# Patient Record
Sex: Female | Born: 1985 | Hispanic: Yes | Marital: Married | Smoking: Never smoker
Health system: Southern US, Community
[De-identification: ages and names within clinical notes are randomized; demographics above are authoritative.]

## PROBLEM LIST (undated history)

## (undated) ENCOUNTER — Inpatient Hospital Stay (HOSPITAL_COMMUNITY): Payer: Self-pay

## (undated) DIAGNOSIS — J45909 Unspecified asthma, uncomplicated: Secondary | ICD-10-CM

## (undated) HISTORY — PX: ABDOMINOPLASTY: SUR9

## (undated) HISTORY — DX: Unspecified asthma, uncomplicated: J45.909

## (undated) HISTORY — PX: RIGHT OOPHORECTOMY: SHX2359

## (undated) HISTORY — PX: BREAST REDUCTION SURGERY: SHX8

---

## 2014-01-28 LAB — OB RESULTS CONSOLE HIV ANTIBODY (ROUTINE TESTING): HIV: NONREACTIVE

## 2014-01-28 LAB — OB RESULTS CONSOLE HGB/HCT, BLOOD
HCT: 40 %
Hemoglobin: 13.8 g/dL

## 2014-01-28 LAB — OB RESULTS CONSOLE ABO/RH: RH TYPE: POSITIVE

## 2014-01-28 LAB — OB RESULTS CONSOLE ANTIBODY SCREEN: Antibody Screen: NEGATIVE

## 2014-01-28 LAB — OB RESULTS CONSOLE PLATELET COUNT: Platelets: 276 10*3/uL

## 2014-01-28 LAB — OB RESULTS CONSOLE HEPATITIS B SURFACE ANTIGEN: HEP B S AG: NEGATIVE

## 2014-01-28 LAB — OB RESULTS CONSOLE RPR: RPR: NONREACTIVE

## 2014-02-20 LAB — US OB COMP + 14 WK

## 2014-04-24 NOTE — L&D Delivery Note (Cosign Needed)
Delivery Note At 7:13 PM a viable female was delivered via  (Presentation: ROA), delivered through loose nuchal x 1 which was reduced immediately after birth.  APGAR: pending at time of note- infant w/ spontaneous cry/respiration; Infant placed directly on mom's abdomen for bonding/skin-to-skin. Cord allowed to stop pulsing, and was clamped x 2, and cut by fob.  Weight: pending at time of note.   Placenta status: delivered spontaneously intact.  Cord: 3VC,  with the following complications: none.  \ Anesthesia:  epidural Episiotomy:  n/a Lacerations:  none Suture Repair: n/a Est. Blood Loss (mL):  350ml  Mom to postpartum.  Baby to Couplet care / Skin to Skin. Plans to breastfeed, depo for contraception, planning circ-undecided about IP vs OP  Marge DuncansBooker, Kellyjo Edgren Randall 08/01/2014, 7:29 PM

## 2014-05-08 ENCOUNTER — Encounter: Payer: Self-pay | Admitting: *Deleted

## 2014-05-14 ENCOUNTER — Encounter: Payer: Self-pay | Admitting: Family Medicine

## 2014-05-18 ENCOUNTER — Encounter: Payer: Self-pay | Admitting: Advanced Practice Midwife

## 2014-05-25 ENCOUNTER — Ambulatory Visit (INDEPENDENT_AMBULATORY_CARE_PROVIDER_SITE_OTHER): Payer: Self-pay | Admitting: Family Medicine

## 2014-05-25 ENCOUNTER — Encounter: Payer: Self-pay | Admitting: Family Medicine

## 2014-05-25 VITALS — BP 111/65 | HR 113 | Temp 98.4°F | Wt 140.0 lb

## 2014-05-25 DIAGNOSIS — O09293 Supervision of pregnancy with other poor reproductive or obstetric history, third trimester: Secondary | ICD-10-CM

## 2014-05-25 DIAGNOSIS — O0993 Supervision of high risk pregnancy, unspecified, third trimester: Secondary | ICD-10-CM

## 2014-05-25 DIAGNOSIS — O0933 Supervision of pregnancy with insufficient antenatal care, third trimester: Secondary | ICD-10-CM | POA: Insufficient documentation

## 2014-05-25 DIAGNOSIS — O09299 Supervision of pregnancy with other poor reproductive or obstetric history, unspecified trimester: Secondary | ICD-10-CM | POA: Insufficient documentation

## 2014-05-25 LAB — POCT URINALYSIS DIP (DEVICE)
BILIRUBIN URINE: NEGATIVE
GLUCOSE, UA: NEGATIVE mg/dL
KETONES UR: NEGATIVE mg/dL
Nitrite: NEGATIVE
PROTEIN: NEGATIVE mg/dL
SPECIFIC GRAVITY, URINE: 1.02 (ref 1.005–1.030)
UROBILINOGEN UA: 0.2 mg/dL (ref 0.0–1.0)
pH: 6 (ref 5.0–8.0)

## 2014-05-25 LAB — CBC
HCT: 36.6 % (ref 36.0–46.0)
Hemoglobin: 12.5 g/dL (ref 12.0–15.0)
MCH: 28.7 pg (ref 26.0–34.0)
MCHC: 34.2 g/dL (ref 30.0–36.0)
MCV: 84.1 fL (ref 78.0–100.0)
MPV: 9.4 fL (ref 8.6–12.4)
PLATELETS: 229 10*3/uL (ref 150–400)
RBC: 4.35 MIL/uL (ref 3.87–5.11)
RDW: 12.7 % (ref 11.5–15.5)
WBC: 7.8 10*3/uL (ref 4.0–10.5)

## 2014-05-25 LAB — GLUCOSE TOLERANCE, 1 HOUR (50G) W/O FASTING: GLUCOSE 1 HOUR GTT: 81 mg/dL (ref 70–140)

## 2014-05-25 LAB — RPR

## 2014-05-25 MED ORDER — ASPIRIN EC 81 MG PO TBEC: 81.0000 mg | DELAYED_RELEASE_TABLET | Freq: Every day | ORAL | Status: DC

## 2014-05-25 NOTE — Progress Notes (Signed)
U/S 06/01/14 @ 1p with Radiology.  Spanish interpreter Maretta LosBlanca Lindner present.

## 2014-05-25 NOTE — Progress Notes (Signed)
C/o of asthma complications since becoming pregnant-- reports having asthma since she was a child though has not been on meds since.  [place] records in hand. Prenatal panel had been drawn. No pap. Pap today with 28 week labs.

## 2014-05-25 NOTE — Progress Notes (Signed)
   Subjective:    Kristy Evans is a [id number] being seen today for her first obstetrical visit.  Her obstetrical history is significant for pre-eclampsia. Patient does not intend to breast feed due to breast reduction. Pregnancy history fully reviewed.  Had some care in [place].  No fevers, rash, headache indicative of Zika virus.  Surgical history includes abdominoplasty and breast reduction.  Patient reports no bleeding, no contractions, no cramping and no leaking.  Filed Vitals:   05/25/14 0913  BP: 111/65  Pulse: 113  Temp: 98.4 F (36.9 C)  Weight: 140 lb (63.504 kg)    HISTORY: OB History  Gravida Para Term Preterm AB SAB TAB Ectopic Multiple Living  3 1 0 1 1 0 1   2    # Outcome Date GA Lbr Len/2nd Weight Sex Delivery Anes PTL Lv  3 Current           2 Preterm 12/26/01 [id number]    Vag-Spont   Y  1 TAB              Past Medical History  Diagnosis Date  . Asthma    Past Surgical History  Procedure Laterality Date  . Breast reduction surgery    . Right oophorectomy     History reviewed. No pertinent family history.   Exam    Uterus:  Fundal Height: 30 cm  Pelvic Exam:    Perineum: No Hemorrhoids, Normal Perineum   Vulva: normal, Bartholin's, Urethra, Skene's normal   Vagina:  normal mucosa   Cervix: multiparous appearance   Adnexa: not evaluated   Bony Pelvis: gynecoid  System:     Skin: normal coloration and turgor, no rashes    Neurologic: oriented   Extremities: normal strength, tone, and muscle mass   HEENT PERRLA and extra ocular movement intact   Mouth/Teeth mucous membranes moist, pharynx normal without lesions   Neck supple and no masses   Cardiovascular: regular rate and rhythm, no murmurs or gallops   Respiratory:  appears well, vitals normal, no respiratory distress, acyanotic, normal RR, ear and throat exam is normal, neck free of mass or lymphadenopathy, chest clear, no wheezing, crepitations, rhonchi, normal symmetric air  entry   Abdomen: soft, non-tender; bowel sounds normal; no masses,  no organomegaly   Urinary: urethral meatus normal      Assessment:    Pregnancy: [id number] Patient Active Problem List   Diagnosis Date Noted  . Supervision of high risk pregnancy in third trimester 05/25/2014  . Insufficient prenatal care in third trimester 05/25/2014  . Hx of preeclampsia, prior pregnancy, currently pregnant 05/25/2014        Plan:     Initial labs drawn. Prenatal vitamins. Problem list reviewed and updated. Genetic Screening: late to care.  Ultrasound discussed; fetal survey: ordered.  Follow up in 2 weeks. 50% of 30 min visit spent on counseling and coordination of care.  28 week labs.  Fetal survey US ordered.    Start ASA 81mg  for preeclampsia prevention.   Candelaria CelesteSTINSON, Siri Buege JEHIEL 05/25/2014

## 2014-05-27 LAB — PRESCRIPTION MONITORING PROFILE (19 PANEL)
Amphetamine/Meth: NEGATIVE ng/mL
BARBITURATE SCREEN, URINE: NEGATIVE ng/mL
Benzodiazepine Screen, Urine: NEGATIVE ng/mL
Buprenorphine, Urine: NEGATIVE ng/mL
Cannabinoid Scrn, Ur: NEGATIVE ng/mL
Carisoprodol, Urine: NEGATIVE ng/mL
Cocaine Metabolites: NEGATIVE ng/mL
Creatinine, Urine: 120.51 mg/dL (ref >20.0)
FENTANYL URINE: NEGATIVE ng/mL
MDMA URINE: NEGATIVE ng/mL
MEPERIDINE UR: NEGATIVE ng/mL
METHADONE SCREEN, URINE: NEGATIVE ng/mL
METHAQUALONE SCREEN (URINE): NEGATIVE ng/mL
Nitrites, Initial: NEGATIVE ug/mL
Opiate Screen, Urine: NEGATIVE ng/mL
Oxycodone Screen, Ur: NEGATIVE ng/mL
PHENCYCLIDINE, UR: NEGATIVE ng/mL
PROPOXYPHENE: NEGATIVE ng/mL
Tapentadol, urine: NEGATIVE ng/mL
Tramadol Scrn, Ur: NEGATIVE ng/mL
ZOLPIDEM, URINE: NEGATIVE ng/mL
pH, Initial: 5.9 pH (ref 4.5–8.9)

## 2014-05-28 LAB — CYTOLOGY - PAP

## 2014-05-30 LAB — CULTURE, OB URINE: Colony Count: 45000

## 2014-06-01 ENCOUNTER — Telehealth: Payer: Self-pay | Admitting: *Deleted

## 2014-06-01 ENCOUNTER — Ambulatory Visit (HOSPITAL_COMMUNITY)
Admission: RE | Admit: 2014-06-01 | Discharge: 2014-06-01 | Disposition: A | Discharge status: Home / Self Care | Payer: Medicaid Other | Source: Ambulatory Visit | Attending: Family Medicine | Admitting: Family Medicine

## 2014-06-01 DIAGNOSIS — O0933 Supervision of pregnancy with insufficient antenatal care, third trimester: Secondary | ICD-10-CM | POA: Diagnosis not present

## 2014-06-01 DIAGNOSIS — Z3689 Encounter for other specified antenatal screening: Secondary | ICD-10-CM | POA: Insufficient documentation

## 2014-06-01 DIAGNOSIS — Z3A31 31 weeks gestation of pregnancy: Secondary | ICD-10-CM | POA: Diagnosis not present

## 2014-06-01 DIAGNOSIS — O09213 Supervision of pregnancy with history of pre-term labor, third trimester: Secondary | ICD-10-CM | POA: Diagnosis not present

## 2014-06-01 DIAGNOSIS — Z36 Encounter for antenatal screening of mother: Secondary | ICD-10-CM | POA: Diagnosis present

## 2014-06-01 DIAGNOSIS — O0993 Supervision of high risk pregnancy, unspecified, third trimester: Secondary | ICD-10-CM

## 2014-06-01 LAB — RUBELLA ANTIBODY, IGM: Rubella IgM: 0.49

## 2014-06-01 NOTE — Telephone Encounter (Signed)
[facility] there is confusion regarding EDD. There is an ultrasound scanned in from 02/20/14 from outside the system, but EDD is selected from a 02/02/14 ultrasound which is not scanned in.  Reveiwed chart and could not find another ultrasound or selection of EDD selection. They will put their reccomendations on ultrasound and will discuss with patient with interpreter her LMP/ ultrasound history.  Will send note to Dr. Adrian BlackwaterStinson.

## 2014-06-02 NOTE — Telephone Encounter (Signed)
In her scanned records, she had an earlier US with a due date of 07/31/14.  That is the most accurate dating.

## 2014-06-03 ENCOUNTER — Telehealth: Payer: Self-pay | Admitting: General Practice

## 2014-06-03 NOTE — Telephone Encounter (Signed)
-----   Message from Levie HeritageJacob J Stinson, DO sent at 06/02/2014  4:09 PM EST ----- Patient had positive Urine Cx at 45K colonies for pseudomonas.  Please call patient and have her come in for a single dose of rocephin 1g IM.  Thanks.

## 2014-06-03 NOTE — Telephone Encounter (Signed)
Called patient with [employer] for intepreter- no answer. Left message we are trying to reach you with some results, nothing urgent but please call us back.

## 2014-06-08 ENCOUNTER — Encounter: Payer: Self-pay | Admitting: Family Medicine

## 2014-06-10 NOTE — Telephone Encounter (Signed)
Called patient. Husband answered and stated he was at work and works Tuesday through Friday. Informed him we are trying to reach his wife with results-- husband states she will be in clinic 06/15/14. Asked that he have patient call as we would like to inform her of results and give treatment before hand if possible. Verbalized understanding.

## 2014-06-11 ENCOUNTER — Encounter: Payer: Self-pay | Admitting: *Deleted

## 2014-06-11 NOTE — Telephone Encounter (Signed)
Patient returned call to front office staff this morning. States she does not have transportation until Monday when her husband is off. Informed her we would like to treat her beforehand but that if she cannot come until Monday then we will treat her then. Patient verbalized understanding and stated she will likely have to wait until Monday.

## 2014-06-15 ENCOUNTER — Ambulatory Visit (INDEPENDENT_AMBULATORY_CARE_PROVIDER_SITE_OTHER): Payer: Medicaid Other | Admitting: Obstetrics & Gynecology

## 2014-06-15 ENCOUNTER — Encounter: Payer: Self-pay | Admitting: Obstetrics & Gynecology

## 2014-06-15 VITALS — BP 112/71 | HR 103 | Temp 98.2°F | Wt 138.6 lb

## 2014-06-15 DIAGNOSIS — N390 Urinary tract infection, site not specified: Secondary | ICD-10-CM

## 2014-06-15 DIAGNOSIS — N6099 Unspecified benign mammary dysplasia of unspecified breast: Secondary | ICD-10-CM

## 2014-06-15 DIAGNOSIS — O0993 Supervision of high risk pregnancy, unspecified, third trimester: Secondary | ICD-10-CM | POA: Diagnosis not present

## 2014-06-15 DIAGNOSIS — O2343 Unspecified infection of urinary tract in pregnancy, third trimester: Secondary | ICD-10-CM | POA: Diagnosis not present

## 2014-06-15 DIAGNOSIS — N62 Hypertrophy of breast: Secondary | ICD-10-CM

## 2014-06-15 DIAGNOSIS — Z23 Encounter for immunization: Secondary | ICD-10-CM

## 2014-06-15 LAB — POCT URINALYSIS DIP (DEVICE)
BILIRUBIN URINE: NEGATIVE
GLUCOSE, UA: NEGATIVE mg/dL
Hgb urine dipstick: NEGATIVE
Ketones, ur: NEGATIVE mg/dL
Nitrite: NEGATIVE
PH: 6.5 (ref 5.0–8.0)
Protein, ur: NEGATIVE mg/dL
Specific Gravity, Urine: 1.02 (ref 1.005–1.030)
Urobilinogen, UA: 0.2 mg/dL (ref 0.0–1.0)

## 2014-06-15 MED ORDER — CEFTRIAXONE SODIUM 1 G IJ SOLR
250.0000 mg | Freq: Once | INTRAMUSCULAR | Status: AC
  Administered 2014-06-15: 250 mg via INTRAMUSCULAR

## 2014-06-15 MED ORDER — TETANUS-DIPHTH-ACELL PERTUSSIS 5-2.5-18.5 LF-MCG/0.5 IM SUSP
0.5000 mL | Freq: Once | INTRAMUSCULAR | Status: AC
  Administered 2014-06-15: 0.5 mL via INTRAMUSCULAR

## 2014-06-15 MED ORDER — ASPIRIN EC 81 MG PO TBEC: 81.0000 mg | DELAYED_RELEASE_TABLET | Freq: Every day | ORAL | Status: DC

## 2014-06-15 NOTE — Progress Notes (Signed)
Patient not taking baby aspirin.  [name] used as interpreter for this encounter.  Needs 1gm Rocephin IM for UTI. Tdap today.

## 2014-06-15 NOTE — Patient Instructions (Signed)
Tercer trimestre de embarazo (Third Trimester of Pregnancy) El tercer trimestre va desde la semana29 hasta la 42, desde el sptimo hasta el noveno mes, y es la poca en la que el feto crece ms rpidamente. Hacia el final del noveno mes, el feto mide alrededor de 20pulgadas (45cm) de largo y pesa entre 6 y 10 libras (2,700 y 4,500kg).  CAMBIOS EN EL ORGANISMO Su organismo atraviesa por muchos cambios durante el embarazo, y estos varan de una mujer a otra.   Seguir aumentando de peso. Es de esperar que aumente entre 25 y 35libras (11 y 16kg) hacia el final del embarazo.  Podrn aparecer las primeras estras en las caderas, el abdomen y las mamas.  Puede tener necesidad de orinar con ms frecuencia porque el feto baja hacia la pelvis y ejerce presin sobre la vejiga.  Debido al embarazo podr sentir acidez estomacal con frecuencia.  Puede estar estreida, ya que ciertas hormonas enlentecen los movimientos de los msculos que empujan los desechos a travs de los intestinos.  Pueden aparecer hemorroides o abultarse e hincharse las venas (venas varicosas).  Puede sentir dolor plvico debido al aumento de peso y a que las hormonas del embarazo relajan las articulaciones entre los huesos de la pelvis. El dolor de espalda puede ser consecuencia de la sobrecarga de los msculos que soportan la postura.  Tal vez haya cambios en el cabello que pueden incluir su engrosamiento, crecimiento rpido y cambios en la textura. Adems, a algunas mujeres se les cae el cabello durante o despus del embarazo, o tienen el cabello seco o fino. Lo ms probable es que el cabello se le normalice despus del nacimiento del beb.  Las mamas seguirn creciendo y le dolern. A veces, puede haber una secrecin amarilla de las mamas llamada calostro.  El ombligo puede salir hacia afuera.  Puede sentir que le falta el aire debido a que se expande el tero.  Puede notar que el feto "baja" o lo siente ms bajo, en el  abdomen.  Puede tener una prdida de secrecin mucosa con sangre. Esto suele ocurrir en el trmino de unos pocos das a una semana antes de que comience el trabajo de parto.  El cuello del tero se vuelve delgado y blando (se borra) cerca de la fecha de parto. QU DEBE ESPERAR EN LOS EXMENES PRENATALES  Le harn exmenes prenatales cada 2semanas hasta la semana36. A partir de ese momento le harn exmenes semanales. Durante una visita prenatal de rutina:  La pesarn para asegurarse de que usted y el feto estn creciendo normalmente.  Le tomarn la presin arterial.  Le medirn el abdomen para controlar el desarrollo del beb.  Se escucharn los latidos cardacos fetales.  Se evaluarn los resultados de los estudios solicitados en visitas anteriores.  Le revisarn el cuello del tero cuando est prxima la fecha de parto para controlar si este se ha borrado. Alrededor de la semana36, el mdico le revisar el cuello del tero. Al mismo tiempo, realizar un anlisis de las secreciones del tejido vaginal. Este examen es para determinar si hay un tipo de bacteria, estreptococo Grupo B. El mdico le explicar esto con ms detalle. El mdico puede preguntarle lo siguiente:  Cmo le gustara que fuera el parto.  Cmo se siente.  Si siente los movimientos del beb.  Si ha tenido sntomas anormales, como prdida de lquido, sangrado, dolores de cabeza intensos o clicos abdominales.  Si tiene alguna pregunta. Otros exmenes o estudios de deteccin que pueden realizarse   durante el tercer trimestre incluyen lo siguiente:  Anlisis de sangre para controlar las concentraciones de hierro (anemia).  Controles fetales para determinar su salud, nivel de actividad y crecimiento. Si tiene alguna enfermedad o hay problemas durante el embarazo, le harn estudios. FALSO TRABAJO DE PARTO Es posible que sienta contracciones leves e irregulares que finalmente desaparecen. Se llaman contracciones de  Braxton Hicks o falso trabajo de parto. Las contracciones pueden durar horas, das o incluso semanas, antes de que el verdadero trabajo de parto se inicie. Si las contracciones ocurren a intervalos regulares, se intensifican o se hacen dolorosas, lo mejor es que la revise el mdico.  SIGNOS DE TRABAJO DE PARTO   Clicos de tipo menstrual.  Contracciones cada 5minutos o menos.  Contracciones que comienzan en la parte superior del tero y se extienden hacia abajo, a la zona inferior del abdomen y la espalda.  Sensacin de mayor presin en la pelvis o dolor de espalda.  Una secrecin de mucosidad acuosa o con sangre que sale de la vagina. Si tiene alguno de estos signos antes de la semana37 del embarazo, llame a su mdico de inmediato. Debe concurrir al hospital para que la controlen inmediatamente. INSTRUCCIONES PARA EL CUIDADO EN EL HOGAR   Evite fumar, consumir hierbas, beber alcohol y tomar frmacos que no le hayan recetado. Estas sustancias qumicas afectan la formacin y el desarrollo del beb.  Siga las indicaciones del mdico en relacin con el uso de medicamentos. Durante el embarazo, hay medicamentos que son seguros de tomar y otros que no.  Haga actividad fsica solo en la forma indicada por el mdico. Sentir clicos uterinos es un buen signo para detener la actividad fsica.  Contine comiendo alimentos que sanos con regularidad.  Use un sostn que le brinde buen soporte si le duelen las mamas.  No se d baos de inmersin en agua caliente, baos turcos ni saunas.  Colquese el cinturn de seguridad cuando conduzca.  No coma carne cruda ni queso sin cocinar; evite el contacto con las bandejas sanitarias de los gatos y la tierra que estos animales usan. Estos elementos contienen grmenes que pueden causar defectos congnitos en el beb.  Tome las vitaminas prenatales.  Si est estreida, pruebe un laxante suave (si el mdico lo autoriza). Consuma ms alimentos ricos en  fibra, como vegetales y frutas frescos y cereales integrales. Beba gran cantidad de lquido para mantener la orina de tono claro o color amarillo plido.  Dese baos de asiento con agua tibia para aliviar el dolor o las molestias causadas por las hemorroides. Use una crema para las hemorroides si el mdico la autoriza.  Si tiene venas varicosas, use medias de descanso. Eleve los pies durante 15minutos, 3 o 4veces por da. Limite la cantidad de sal en su dieta.  Evite levantar objetos pesados, use zapatos de tacones bajos y mantenga una buena postura.  Descanse con las piernas elevadas si tiene calambres o dolor de cintura.  Visite a su dentista si no lo ha hecho durante el embarazo. Use un cepillo de dientes blando para higienizarse los dientes y psese el hilo dental con suavidad.  Puede seguir manteniendo relaciones sexuales, a menos que el mdico le indique lo contrario.  No haga viajes largos excepto que sea absolutamente necesario y solo con la autorizacin del mdico.  Tome clases prenatales para entender, practicar y hacer preguntas sobre el trabajo de parto y el parto.  Haga un ensayo de la partida al hospital.  Prepare el bolso que   llevar al hospital.  Prepare la habitacin del beb.  Concurra a todas las visitas prenatales segn las indicaciones de su mdico. SOLICITE ATENCIN MDICA SI:  No est segura de que est en trabajo de parto o de que ha roto la bolsa de las aguas.  Tiene mareos.  Siente clicos leves, presin en la pelvis o dolor persistente en el abdomen.  Tiene nuseas, vmitos o diarrea persistentes.  Tiene secrecin vaginal con mal olor.  Siente dolor al orinar. SOLICITE ATENCIN MDICA DE INMEDIATO SI:   Tiene fiebre.  Tiene una prdida de lquido por la vagina.  Tiene sangrado o pequeas prdidas vaginales.  Siente dolor intenso o clicos en el abdomen.  Sube o baja de peso rpidamente.  Tiene dificultad para respirar y siente dolor de  pecho.  Sbitamente se le hinchan mucho el rostro, las manos, los tobillos, los pies o las piernas.  No ha sentido los movimientos del beb durante una hora.  Siente un dolor de cabeza intenso que no se alivia con medicamentos.  Hay cambios en la visin. Document Released: 01/18/2005 Document Revised: 04/15/2013 [employer] Patient Information 2015 [employer]. This information is not intended to replace advice given to you by your health care provider. Make sure you discuss any questions you have with your health care provider.  

## 2014-06-15 NOTE — Progress Notes (Signed)
Discussed hyperplasia breast tissue with atypia

## 2014-06-16 ENCOUNTER — Telehealth: Payer: Self-pay

## 2014-06-16 MED ORDER — ASPIRIN EC 81 MG PO TBEC: 81.0000 mg | DELAYED_RELEASE_TABLET | Freq: Every day | ORAL | Status: DC

## 2014-06-16 NOTE — Telephone Encounter (Signed)
Patient's husband called stating he went to pick up his wife's Rx at the pharmacy and his insurance has not come through yet so will not pay for RX -- asking what he should do. Called and spoke to husband regarding RX for baby aspirin-- informed him he can buy it OTC and that it is very inexpensive. Advised his wife just take 1 tablet 81mg  aspirin daily. Husband verbalized understanding and gratitude. No further questions or concerns.

## 2014-06-29 ENCOUNTER — Ambulatory Visit (INDEPENDENT_AMBULATORY_CARE_PROVIDER_SITE_OTHER): Payer: Self-pay | Admitting: Family Medicine

## 2014-06-29 ENCOUNTER — Ambulatory Visit (HOSPITAL_COMMUNITY)
Admission: RE | Admit: 2014-06-29 | Discharge: 2014-06-29 | Disposition: A | Discharge status: Home / Self Care | Payer: Medicaid Other | Source: Ambulatory Visit | Attending: Family Medicine | Admitting: Family Medicine

## 2014-06-29 ENCOUNTER — Other Ambulatory Visit: Payer: Self-pay | Admitting: Family Medicine

## 2014-06-29 ENCOUNTER — Encounter: Payer: Self-pay | Admitting: Obstetrics and Gynecology

## 2014-06-29 VITALS — BP 120/72 | HR 95 | Temp 98.2°F | Ht <= 58 in | Wt 140.9 lb

## 2014-06-29 DIAGNOSIS — O368131 Decreased fetal movements, third trimester, fetus 1: Secondary | ICD-10-CM | POA: Diagnosis not present

## 2014-06-29 DIAGNOSIS — O2613 Low weight gain in pregnancy, third trimester: Secondary | ICD-10-CM

## 2014-06-29 DIAGNOSIS — O2610 Low weight gain in pregnancy, unspecified trimester: Secondary | ICD-10-CM | POA: Insufficient documentation

## 2014-06-29 DIAGNOSIS — O0993 Supervision of high risk pregnancy, unspecified, third trimester: Secondary | ICD-10-CM

## 2014-06-29 DIAGNOSIS — Z3A35 35 weeks gestation of pregnancy: Secondary | ICD-10-CM | POA: Insufficient documentation

## 2014-06-29 DIAGNOSIS — O09293 Supervision of pregnancy with other poor reproductive or obstetric history, third trimester: Secondary | ICD-10-CM | POA: Insufficient documentation

## 2014-06-29 DIAGNOSIS — O368190 Decreased fetal movements, unspecified trimester, not applicable or unspecified: Secondary | ICD-10-CM | POA: Insufficient documentation

## 2014-06-29 LAB — POCT URINALYSIS DIP (DEVICE)
Bilirubin Urine: NEGATIVE
GLUCOSE, UA: 100 mg/dL — AB
HGB URINE DIPSTICK: NEGATIVE
Ketones, ur: NEGATIVE mg/dL
Leukocytes, UA: NEGATIVE
NITRITE: NEGATIVE
Protein, ur: NEGATIVE mg/dL
Specific Gravity, Urine: 1.025 (ref 1.005–1.030)
UROBILINOGEN UA: 0.2 mg/dL (ref 0.0–1.0)
pH: 5.5 (ref 5.0–8.0)

## 2014-06-29 NOTE — Progress Notes (Signed)
BPP/ Growth U/S 06/29/14 @ 4p with Radiology.

## 2014-06-29 NOTE — Patient Instructions (Signed)
Tercer trimestre de embarazo (Third Trimester of Pregnancy) El tercer trimestre va desde la semana29 hasta la 42, desde el sptimo hasta el noveno mes, y es la poca en la que el feto crece ms rpidamente. Hacia el final del noveno mes, el feto mide alrededor de 20pulgadas (45cm) de largo y pesa entre 6 y 10 libras (2,700 y 4,500kg).  CAMBIOS EN EL ORGANISMO Su organismo atraviesa por muchos cambios durante el embarazo, y estos varan de una mujer a otra.   Seguir aumentando de peso. Es de esperar que aumente entre 25 y 35libras (11 y 16kg) hacia el final del embarazo.  Podrn aparecer las primeras estras en las caderas, el abdomen y las mamas.  Puede tener necesidad de orinar con ms frecuencia porque el feto baja hacia la pelvis y ejerce presin sobre la vejiga.  Debido al embarazo podr sentir acidez estomacal con frecuencia.  Puede estar estreida, ya que ciertas hormonas enlentecen los movimientos de los msculos que empujan los desechos a travs de los intestinos.  Pueden aparecer hemorroides o abultarse e hincharse las venas (venas varicosas).  Puede sentir dolor plvico debido al aumento de peso y a que las hormonas del embarazo relajan las articulaciones entre los huesos de la pelvis. El dolor de espalda puede ser consecuencia de la sobrecarga de los msculos que soportan la postura.  Tal vez haya cambios en el cabello que pueden incluir su engrosamiento, crecimiento rpido y cambios en la textura. Adems, a algunas mujeres se les cae el cabello durante o despus del embarazo, o tienen el cabello seco o fino. Lo ms probable es que el cabello se le normalice despus del nacimiento del beb.  Las mamas seguirn creciendo y le dolern. A veces, puede haber una secrecin amarilla de las mamas llamada calostro.  El ombligo puede salir hacia afuera.  Puede sentir que le falta el aire debido a que se expande el tero.  Puede notar que el feto "baja" o lo siente ms bajo, en  el abdomen.  Puede tener una prdida de secrecin mucosa con sangre. Esto suele ocurrir en el trmino de unos pocos das a una semana antes de que comience el trabajo de parto.  El cuello del tero se vuelve delgado y blando (se borra) cerca de la fecha de parto. QU DEBE ESPERAR EN LOS EXMENES PRENATALES  Le harn exmenes prenatales cada 2semanas hasta la semana36. A partir de ese momento le harn exmenes semanales. Durante una visita prenatal de rutina:  La pesarn para asegurarse de que usted y el feto estn creciendo normalmente.  Le tomarn la presin arterial.  Le medirn el abdomen para controlar el desarrollo del beb.  Se escucharn los latidos cardacos fetales.  Se evaluarn los resultados de los estudios solicitados en visitas anteriores.  Le revisarn el cuello del tero cuando est prxima la fecha de parto para controlar si este se ha borrado. Alrededor de la semana36, el mdico le revisar el cuello del tero. Al mismo tiempo, realizar un anlisis de las secreciones del tejido vaginal. Este examen es para determinar si hay un tipo de bacteria, estreptococo Grupo B. El mdico le explicar esto con ms detalle. El mdico puede preguntarle lo siguiente:  Cmo le gustara que fuera el parto.  Cmo se siente.  Si siente los movimientos del beb.  Si ha tenido sntomas anormales, como prdida de lquido, sangrado, dolores de cabeza intensos o clicos abdominales.  Si tiene alguna pregunta. Otros exmenes o estudios de deteccin que pueden realizarse   durante el tercer trimestre incluyen lo siguiente:  Anlisis de sangre para controlar las concentraciones de hierro (anemia).  Controles fetales para determinar su salud, nivel de actividad y crecimiento. Si tiene alguna enfermedad o hay problemas durante el embarazo, le harn estudios. FALSO TRABAJO DE PARTO Es posible que sienta contracciones leves e irregulares que finalmente desaparecen. Se llaman contracciones de  Braxton Hicks o falso trabajo de parto. Las contracciones pueden durar horas, das o incluso semanas, antes de que el verdadero trabajo de parto se inicie. Si las contracciones ocurren a intervalos regulares, se intensifican o se hacen dolorosas, lo mejor es que la revise el mdico.  SIGNOS DE TRABAJO DE PARTO   Clicos de tipo menstrual.  Contracciones cada 5minutos o menos.  Contracciones que comienzan en la parte superior del tero y se extienden hacia abajo, a la zona inferior del abdomen y la espalda.  Sensacin de mayor presin en la pelvis o dolor de espalda.  Una secrecin de mucosidad acuosa o con sangre que sale de la vagina. Si tiene alguno de estos signos antes de la semana37 del embarazo, llame a su mdico de inmediato. Debe concurrir al hospital para que la controlen inmediatamente. INSTRUCCIONES PARA EL CUIDADO EN EL HOGAR   Evite fumar, consumir hierbas, beber alcohol y tomar frmacos que no le hayan recetado. Estas sustancias qumicas afectan la formacin y el desarrollo del beb.  Siga las indicaciones del mdico en relacin con el uso de medicamentos. Durante el embarazo, hay medicamentos que son seguros de tomar y otros que no.  Haga actividad fsica solo en la forma indicada por el mdico. Sentir clicos uterinos es un buen signo para detener la actividad fsica.  Contine comiendo alimentos que sanos con regularidad.  Use un sostn que le brinde buen soporte si le duelen las mamas.  No se d baos de inmersin en agua caliente, baos turcos ni saunas.  Colquese el cinturn de seguridad cuando conduzca.  No coma carne cruda ni queso sin cocinar; evite el contacto con las bandejas sanitarias de los gatos y la tierra que estos animales usan. Estos elementos contienen grmenes que pueden causar defectos congnitos en el beb.  Tome las vitaminas prenatales.  Si est estreida, pruebe un laxante suave (si el mdico lo autoriza). Consuma ms alimentos ricos en  fibra, como vegetales y frutas frescos y cereales integrales. Beba gran cantidad de lquido para mantener la orina de tono claro o color amarillo plido.  Dese baos de asiento con agua tibia para aliviar el dolor o las molestias causadas por las hemorroides. Use una crema para las hemorroides si el mdico la autoriza.  Si tiene venas varicosas, use medias de descanso. Eleve los pies durante 15minutos, 3 o 4veces por da. Limite la cantidad de sal en su dieta.  Evite levantar objetos pesados, use zapatos de tacones bajos y mantenga una buena postura.  Descanse con las piernas elevadas si tiene calambres o dolor de cintura.  Visite a su dentista si no lo ha hecho durante el embarazo. Use un cepillo de dientes blando para higienizarse los dientes y psese el hilo dental con suavidad.  Puede seguir manteniendo relaciones sexuales, a menos que el mdico le indique lo contrario.  No haga viajes largos excepto que sea absolutamente necesario y solo con la autorizacin del mdico.  Tome clases prenatales para entender, practicar y hacer preguntas sobre el trabajo de parto y el parto.  Haga un ensayo de la partida al hospital.  Prepare el bolso que   llevar al hospital.  Prepare la habitacin del beb.  Concurra a todas las visitas prenatales segn las indicaciones de su mdico. SOLICITE ATENCIN MDICA SI:  No est segura de que est en trabajo de parto o de que ha roto la bolsa de las aguas.  Tiene mareos.  Siente clicos leves, presin en la pelvis o dolor persistente en el abdomen.  Tiene nuseas, vmitos o diarrea persistentes.  Tiene secrecin vaginal con mal olor.  Siente dolor al orinar. SOLICITE ATENCIN MDICA DE INMEDIATO SI:   Tiene fiebre.  Tiene una prdida de lquido por la vagina.  Tiene sangrado o pequeas prdidas vaginales.  Siente dolor intenso o clicos en el abdomen.  Sube o baja de peso rpidamente.  Tiene dificultad para respirar y siente dolor de  pecho.  Sbitamente se le hinchan mucho el rostro, las manos, los tobillos, los pies o las piernas.  No ha sentido los movimientos del beb durante una hora.  Siente un dolor de cabeza intenso que no se alivia con medicamentos.  Hay cambios en la visin. Document Released: 01/18/2005 Document Revised: 04/15/2013 [employer] Patient Information 2015 [employer]. This information is not intended to replace advice given to you by your health care provider. Make sure you discuss any questions you have with your health care provider.  Lactancia materna (Breastfeeding) Decidir amamantar es una de las mejores elecciones que puede hacer por usted y su beb. El cambio hormonal durante el embarazo produce el desarrollo del tejido mamario y aumenta la cantidad y el tamao de los conductos galactforos. Estas hormonas tambin permiten que las protenas, los azcares y las grasas de la sangre produzcan la leche materna en las glndulas productoras de leche. Las hormonas impiden que la leche materna sea liberada antes del nacimiento del beb, adems de impulsar el flujo de leche luego del nacimiento. Una vez que ha comenzado a amamantar, pensar en el beb, as como la succin o el llanto, pueden estimular la liberacin de leche de las glndulas productoras de leche.  LOS BENEFICIOS DE AMAMANTAR Para el beb  La primera leche (calostro) ayuda a mejorar el funcionamiento del sistema digestivo del beb.  La leche tiene anticuerpos que ayudan a prevenir las infecciones en el beb.  El beb tiene una menor incidencia de asma, alergias y del sndrome de muerte sbita del lactante.  Los nutrientes en la leche materna son mejores para el beb que la leche maternizada y estn preparados exclusivamente para cubrir las necesidades del beb.  La leche materna mejora el desarrollo cerebral del beb.  Es menos probable que el beb desarrolle otras enfermedades, como obesidad infantil, asma o diabetes mellitus de  tipo 2. Para usted   La lactancia materna favorece el desarrollo de un vnculo muy especial entre la madre y el beb.  Es conveniente. La leche materna siempre est disponible a la temperatura correcta y es econmica.  La lactancia materna ayuda a quemar caloras y a perder el peso ganado durante el embarazo.  Favorece la contraccin del tero al tamao que tena antes del embarazo de manera ms rpida y disminuye el sangrado (loquios) despus del parto.  La lactancia materna contribuye a reducir el riesgo de desarrollar diabetes mellitus de tipo 2, osteoporosis o cncer de mama o de ovario en el futuro. SIGNOS DE QUE EL BEB EST HAMBRIENTO Primeros signos de hambre  Aumenta su estado de alerta o actividad.  Se estira.  Mueve la cabeza de un lado a otro.  Mueve la cabeza y   abre la boca cuando se le toca la mejilla o la comisura de la boca (reflejo de bsqueda).  Aumenta las vocalizaciones, tales como sonidos de succin, se relame los labios, emite arrullos, suspiros, o chirridos.  Mueve la mano hacia la boca.  Se chupa con ganas los dedos o las manos. Signos tardos de hambre  Est agitado.  Llora de manera intermitente. Signos de hambre extrema Los signos de hambre extrema requerirn que lo calme y lo consuele antes de que el beb pueda alimentarse adecuadamente. No espere a que se manifiesten los siguientes signos de hambre extrema para comenzar a amamantar:   Agitacin.  Llanto intenso y fuerte.   Gritos. INFORMACIN BSICA SOBRE LA LACTANCIA MATERNA Iniciacin de la lactancia materna  Encuentre un lugar cmodo para sentarse o acostarse, con un buen respaldo para el cuello y la espalda.  Coloque una almohada o una manta enrollada debajo del beb para acomodarlo a la altura de la mama (si est sentada). Las almohadas para amamantar se han diseado especialmente a fin de servir de apoyo para los brazos y el beb mientras amamanta.  Asegrese de que el abdomen del  beb est frente al suyo.  Masajee suavemente la mama. Con las yemas de los dedos, masajee la pared del pecho hacia el pezn en un movimiento circular. Esto estimula el flujo de leche. Es posible que deba continuar este movimiento mientras amamanta si la leche fluye lentamente.  Sostenga la mama con el pulgar por arriba del pezn y los otros 4 dedos por debajo de la mama. Asegrese de que los dedos se encuentren lejos del pezn y de la boca del beb.  Empuje suavemente los labios del beb con el pezn o con el dedo.  Cuando la boca del beb se abra lo suficiente, acrquelo rpidamente a la mama e introduzca todo el pezn y la zona oscura que lo rodea (areola), tanto como sea posible, dentro de la boca del beb.  Debe haber ms areola visible por arriba del labio superior del beb que por debajo del labio inferior.  La lengua del beb debe estar entre la enca inferior y la mama.  Asegrese de que la boca del beb est en la posicin correcta alrededor del pezn (prendida). Los labios del beb deben crear un sello sobre la mama y estar doblados hacia afuera (invertidos).  Es comn que el beb succione durante 2 a 3 minutos para que comience el flujo de leche materna. Cmo debe prenderse Es muy importante que le ensee al beb cmo prenderse adecuadamente a la mama. Si el beb no se prende adecuadamente, puede causarle dolor en el pezn y reducir la produccin de leche materna, y hacer que el beb tenga un escaso aumento de peso. Adems, si el beb no se prende adecuadamente al pezn, puede tragar aire durante la alimentacin. Esto puede causarle molestias al beb. Hacer eructar al beb al cambiar de mama puede ayudarlo a liberar el aire. Sin embargo, ensearle al beb cmo prenderse a la mama adecuadamente es la mejor manera de evitar que se sienta molesto por tragar aire mientras se alimenta. Signos de que el beb se ha prendido adecuadamente al pezn:   Tironea o succiona de modo silencioso,  sin causarle dolor.  Se escucha que traga cada 3 o 4 succiones.   Hay movimientos musculares por arriba y por delante de sus odos al succionar. Signos de que el beb no se ha prendido adecuadamente al pezn:   Hace ruidos de succin o   de chasquido mientras se alimenta.  Siente dolor en el pezn. Si cree que el beb no se prendi correctamente, deslice el dedo en la comisura de la boca y colquelo entre las encas del beb para interrumpir la succin. Intente comenzar a amamantar nuevamente. Signos de lactancia materna exitosa Signos del beb:   Disminuye gradualmente el nmero de succiones o cesa la succin por completo.  Se duerme.  Relaja el cuerpo.  Retiene una pequea cantidad de leche en la boca.  Se desprende solo del pecho. Signos que presenta usted:  Las mamas han aumentado la firmeza, el peso y el tamao 1 a 3 horas despus de amamantar.  Estn ms blandas inmediatamente despus de amamantar.  Un aumento del volumen de leche, y tambin un cambio en su consistencia y color se producen hacia el quinto da de lactancia materna.  Los pezones no duelen, ni estn agrietados ni sangran. Signos de que su beb recibe la cantidad de leche suficiente  Moja al menos 3 paales en 24 horas. La orina debe ser clara y de color amarillo plido a los 5 das de vida.  Defeca al menos 3 veces en 24 horas a los 5 das de vida. La materia fecal debe ser blanda y amarillenta.  Defeca al menos 3 veces en 24 horas a los 7 das de vida. La materia fecal debe ser grumosa y amarillenta.  No registra una prdida de peso mayor del 10% del peso al nacer durante los primeros 3 das de vida.  Aumenta de peso un promedio de 4 a 7onzas (113 a 198g) por semana despus de los 4 das de vida.  Aumenta de peso, diariamente, de manera uniforme a partir de los 5 das de vida, sin registrar prdida de peso despus de las 2semanas de vida. Despus de alimentarse, es posible que el beb regurgite una  pequea cantidad. Esto es frecuente. FRECUENCIA Y DURACIN DE LA LACTANCIA MATERNA El amamantamiento frecuente la ayudar a producir ms leche y a prevenir problemas de dolor en los pezones e hinchazn en las mamas. Alimente al beb cuando muestre signos de hambre o si siente la necesidad de reducir la congestin de las mamas. Esto se denomina "lactancia a demanda". Evite el uso del chupete mientras trabaja para establecer la lactancia (las primeras 4 a 6 semanas despus del nacimiento del beb). Despus de este perodo, podr ofrecerle un chupete. Las investigaciones demostraron que el uso del chupete durante el primer ao de vida del beb disminuye el riesgo de desarrollar el sndrome de muerte sbita del lactante (SMSL). Permita que el nio se alimente en cada mama todo lo que desee. Contine amamantando al beb hasta que haya terminado de alimentarse. Cuando el beb se desprende o se queda dormido mientras se est alimentando de la primera mama, ofrzcale la segunda. Debido a que, con frecuencia, los recin nacidos permanecen somnolientos las primeras semanas de vida, es posible que deba despertar al beb para alimentarlo. Los horarios de lactancia varan de un beb a otro. Sin embargo, las siguientes reglas pueden servir como gua para ayudarla a garantizar que el beb se alimenta adecuadamente:  Se puede amamantar a los recin nacidos (bebs de 4 semanas o menos de vida) cada 1 a 3 horas.  No deben transcurrir ms de 3 horas durante el da o 5 horas durante la noche sin que se amamante a los recin nacidos.  Debe amamantar al beb 8 veces como mnimo en un perodo de 24 horas, hasta que comience a   introducir slidos en su dieta, a los 6 meses de vida aproximadamente. EXTRACCIN DE LECHE MATERNA La extraccin y el almacenamiento de la leche materna le permiten asegurarse de que el beb se alimente exclusivamente de leche materna, aun en momentos en los que no puede amamantar. Esto tiene especial  importancia si debe regresar al trabajo en el perodo en que an est amamantando o si no puede estar presente en los momentos en que el beb debe alimentarse. Su asesor en lactancia puede orientarla sobre cunto tiempo es seguro almacenar leche materna.  El sacaleche es un aparato que le permite extraer leche de la mama a un recipiente estril. Luego, la leche materna extrada puede almacenarse en un refrigerador o congelador. Algunos sacaleches son manuales, mientras que otros son elctricos. Consulte a su asesor en lactancia qu tipo ser ms conveniente para usted. Los sacaleches se pueden comprar; sin embargo, algunos hospitales y grupos de apoyo a la lactancia materna alquilan sacaleches mensualmente. Un asesor en lactancia puede ensearle cmo extraer leche materna manualmente, en caso de que prefiera no usar un sacaleche.  CMO CUIDAR LAS MAMAS DURANTE LA LACTANCIA MATERNA Los pezones se secan, agrietan y duelen durante la lactancia materna. Las siguientes recomendaciones pueden ayudarla a mantener las mamas humectadas y sanas:  Evite usar jabn en los pezones.  Use un sostn de soporte. Aunque no son esenciales, las camisetas sin mangas o los sostenes especiales para amamantar estn diseados para acceder fcilmente a las mamas, para amamantar sin tener que quitarse todo el sostn o la camiseta. Evite usar sostenes con aro o sostenes muy ajustados.  Seque al aire sus pezones durante 3 a 4minutos despus de amamantar al beb.  Utilice solo apsitos de algodn en el sostn para absorber las prdidas de leche. La prdida de un poco de leche materna entre las tomas es normal.  Utilice lanolina sobre los pezones luego de amamantar. La lanolina ayuda a mantener la humedad normal de la piel. Si usa lanolina pura, no tiene que lavarse los pezones antes de volver a alimentar al beb. La lanolina pura no es txica para el beb. Adems, puede extraer manualmente algunas gotas de leche materna y masajear  suavemente esa leche sobre los pezones, para que la leche se seque al aire. Durante las primeras semanas despus de dar a luz, algunas mujeres pueden experimentar hinchazn en las mamas (congestin mamaria). La congestin puede hacer que sienta las mamas pesadas, calientes y sensibles al tacto. El pico de la congestin ocurre dentro de los 3 a 5 das despus del parto. Las siguientes recomendaciones pueden ayudarla a aliviar la congestin:  Vace por completo las mamas al amamantar o extraer leche. Puede aplicar calor hmedo en las mamas (en la ducha o con toallas hmedas para manos) antes de amamantar o extraer leche. Esto aumenta la circulacin y ayuda a que la leche fluya. Si el beb no vaca por completo las mamas cuando lo amamanta, extraiga la leche restante despus de que haya finalizado.  Use un sostn ajustado (para amamantar o comn) o una camiseta sin mangas durante 1 o 2 das para indicar al cuerpo que disminuya ligeramente la produccin de leche.  Aplique compresas de hielo sobre las mamas, a menos que le resulte demasiado incmodo.  Asegrese de que el beb est prendido y se encuentre en la posicin correcta mientras lo alimenta. Si la congestin persiste luego de 48 horas o despus de seguir estas recomendaciones, comunquese con su mdico o un asesor en lactancia. RECOMENDACIONES GENERALES   PARA EL CUIDADO DE LA SALUD DURANTE LA LACTANCIA MATERNA  Consuma alimentos saludables. Alterne comidas y colaciones, y coma 3 de cada una por da. Dado que lo que come afecta la leche materna, es posible que algunas comidas hagan que su beb se vuelva ms irritable de lo habitual. Evite comer este tipo de alimentos si percibe que afectan de manera negativa al beb.  Beba leche, jugos de fruta y agua para satisfacer su sed (aproximadamente 10 vasos al da).  Descanse con frecuencia, reljese y tome sus vitaminas prenatales para evitar la fatiga, el estrs y la anemia.  Contine con los  autocontroles de la mama.  Evite masticar y fumar tabaco.  Evite el consumo de alcohol y drogas. Algunos medicamentos, que pueden ser perjudiciales para el beb, pueden pasar a travs de la leche materna. Es importante que consulte a su mdico antes de tomar cualquier medicamento, incluidos todos los medicamentos recetados y de venta libre, as como los suplementos vitamnicos y herbales. Puede quedar embarazada durante la lactancia. Si desea controlar la natalidad, consulte a su mdico cules son las opciones ms seguras para el beb. SOLICITE ATENCIN MDICA SI:   Usted siente que quiere dejar de amamantar o se siente frustrada con la lactancia.  Siente dolor en las mamas o en los pezones.  Sus pezones estn agrietados o sangran.  Sus pechos estn irritados, sensibles o calientes.  Tiene un rea hinchada en cualquiera de las mamas.  Siente escalofros o fiebre.  Tiene nuseas o vmitos.  Presenta una secrecin de otro lquido distinto de la leche materna de los pezones.  Sus mamas no se llenan antes de amamantar al beb para el quinto da despus del parto.  Se siente triste y deprimida.  El beb est demasiado somnoliento como para comer bien.  El beb tiene problemas para dormir.  Moja menos de 3 paales en 24 horas.  Defeca menos de 3 veces en 24 horas.  La piel del beb o la parte blanca de los ojos se vuelven amarillentas.  El beb no ha aumentado de peso a los 5 das de vida. SOLICITE ATENCIN MDICA DE INMEDIATO SI:   El beb est muy cansado (letargo) y no se quiere despertar para comer.  Le sube la fiebre sin causa. Document Released: 04/10/2005 Document Revised: 04/15/2013 [employer] Patient Information 2015 [employer]. This information is not intended to replace advice given to you by your health care provider. Make sure you discuss any questions you have with your health care provider.  

## 2014-06-29 NOTE — Progress Notes (Signed)
Spanish interpreter: Kristy Evans used Reports some decreased FM--for BPP and u/s for growth today To see Nutrition for poor weight gain Cultures next week.

## 2014-06-29 NOTE — Progress Notes (Signed)
[name] as Research officer, trade unionpanish Interpreter for Encounter Pt concerned about decrease in fetal movement: pt reports fetal movement 10 mins ago. Pt is concerned about her weight gain, and weight of the baby

## 2014-07-06 ENCOUNTER — Ambulatory Visit (INDEPENDENT_AMBULATORY_CARE_PROVIDER_SITE_OTHER): Payer: Self-pay | Admitting: Obstetrics & Gynecology

## 2014-07-06 VITALS — BP 114/77 | HR 117 | Temp 98.6°F | Wt 140.4 lb

## 2014-07-06 DIAGNOSIS — Z3493 Encounter for supervision of normal pregnancy, unspecified, third trimester: Secondary | ICD-10-CM

## 2014-07-06 DIAGNOSIS — O0993 Supervision of high risk pregnancy, unspecified, third trimester: Secondary | ICD-10-CM

## 2014-07-06 DIAGNOSIS — O09293 Supervision of pregnancy with other poor reproductive or obstetric history, third trimester: Secondary | ICD-10-CM

## 2014-07-06 LAB — POCT URINALYSIS DIP (DEVICE)
Bilirubin Urine: NEGATIVE
GLUCOSE, UA: NEGATIVE mg/dL
Ketones, ur: NEGATIVE mg/dL
LEUKOCYTES UA: NEGATIVE
Nitrite: NEGATIVE
PROTEIN: NEGATIVE mg/dL
Specific Gravity, Urine: 1.015 (ref 1.005–1.030)
UROBILINOGEN UA: 0.2 mg/dL (ref 0.0–1.0)
pH: 6 (ref 5.0–8.0)

## 2014-07-06 LAB — OB RESULTS CONSOLE GC/CHLAMYDIA
Chlamydia: NEGATIVE
Gonorrhea: NEGATIVE

## 2014-07-06 LAB — OB RESULTS CONSOLE GBS: STREP GROUP B AG: NEGATIVE

## 2014-07-06 NOTE — Patient Instructions (Signed)
Return to clinic for any obstetric concerns or go to [facility] for evaluation  

## 2014-07-06 NOTE — Progress Notes (Signed)
Patient is Spanish-speaking only, Spanish interpreter present for this encounter. Pelvic cultures today.  No other complaints or concerns.  Labor and fetal movement precautions reviewed.

## 2014-07-06 NOTE — Progress Notes (Signed)
Kristy Evans Spanish Interpreter for encounter 36 wk cultures today Pt wants to discuss privacy in the labor room

## 2014-07-07 LAB — GC/CHLAMYDIA PROBE AMP
CT PROBE, AMP APTIMA: NEGATIVE
GC PROBE AMP APTIMA: NEGATIVE

## 2014-07-08 LAB — CULTURE, BETA STREP (GROUP B ONLY)

## 2014-07-13 ENCOUNTER — Ambulatory Visit (INDEPENDENT_AMBULATORY_CARE_PROVIDER_SITE_OTHER): Payer: Self-pay | Admitting: Family Medicine

## 2014-07-13 VITALS — BP 124/79 | HR 102 | Temp 98.2°F | Wt 139.0 lb

## 2014-07-13 DIAGNOSIS — O0993 Supervision of high risk pregnancy, unspecified, third trimester: Secondary | ICD-10-CM

## 2014-07-13 DIAGNOSIS — O09293 Supervision of pregnancy with other poor reproductive or obstetric history, third trimester: Secondary | ICD-10-CM

## 2014-07-13 LAB — POCT URINALYSIS DIP (DEVICE)
Bilirubin Urine: NEGATIVE
Glucose, UA: NEGATIVE mg/dL
HGB URINE DIPSTICK: NEGATIVE
KETONES UR: NEGATIVE mg/dL
Nitrite: NEGATIVE
PROTEIN: NEGATIVE mg/dL
SPECIFIC GRAVITY, URINE: 1.015 (ref 1.005–1.030)
Urobilinogen, UA: 0.2 mg/dL (ref 0.0–1.0)
pH: 6.5 (ref 5.0–8.0)

## 2014-07-13 NOTE — Progress Notes (Signed)
C/o intermittent back pain.  Kristy Evans used as interpreter for this encounter.

## 2014-07-13 NOTE — Progress Notes (Signed)
Patient is 29 y.o. [id number].  +FM, denies LOF, VB, contractions, vaginal discharge.  Overall feeling well. - some back pain, not taking anything for pain

## 2014-07-20 ENCOUNTER — Ambulatory Visit (INDEPENDENT_AMBULATORY_CARE_PROVIDER_SITE_OTHER): Payer: Self-pay | Admitting: Obstetrics & Gynecology

## 2014-07-20 VITALS — BP 117/81 | HR 99 | Temp 98.2°F | Wt 140.5 lb

## 2014-07-20 DIAGNOSIS — O0993 Supervision of high risk pregnancy, unspecified, third trimester: Secondary | ICD-10-CM

## 2014-07-20 DIAGNOSIS — O09293 Supervision of pregnancy with other poor reproductive or obstetric history, third trimester: Secondary | ICD-10-CM

## 2014-07-20 LAB — POCT URINALYSIS DIP (DEVICE)
BILIRUBIN URINE: NEGATIVE
GLUCOSE, UA: NEGATIVE mg/dL
HGB URINE DIPSTICK: NEGATIVE
Ketones, ur: NEGATIVE mg/dL
NITRITE: NEGATIVE
PH: 6.5 (ref 5.0–8.0)
Protein, ur: NEGATIVE mg/dL
Specific Gravity, Urine: 1.015 (ref 1.005–1.030)
UROBILINOGEN UA: 0.2 mg/dL (ref 0.0–1.0)

## 2014-07-20 NOTE — Progress Notes (Signed)
C/o pelvic pressure and occasional pain.

## 2014-07-20 NOTE — Progress Notes (Signed)
Patient is Spanish-speaking only, Spanish interpreter present for this encounter. Patient reassured.  No other complaints or concerns.  Labor and fetal movement precautions reviewed.

## 2014-07-20 NOTE — Patient Instructions (Signed)
Regrese a la clinica cuando tenga su cita. Si tiene problemas o preguntas, llama a la clinica o vaya a la [facility] al [facility].    

## 2014-07-27 ENCOUNTER — Ambulatory Visit (INDEPENDENT_AMBULATORY_CARE_PROVIDER_SITE_OTHER): Payer: Medicaid Other | Admitting: Obstetrics & Gynecology

## 2014-07-27 VITALS — BP 112/73 | HR 119 | Temp 98.3°F | Wt 142.5 lb

## 2014-07-27 DIAGNOSIS — O09293 Supervision of pregnancy with other poor reproductive or obstetric history, third trimester: Secondary | ICD-10-CM

## 2014-07-27 LAB — POCT URINALYSIS DIP (DEVICE)
BILIRUBIN URINE: NEGATIVE
GLUCOSE, UA: NEGATIVE mg/dL
Hgb urine dipstick: NEGATIVE
KETONES UR: NEGATIVE mg/dL
NITRITE: NEGATIVE
PH: 6 (ref 5.0–8.0)
Protein, ur: NEGATIVE mg/dL
Specific Gravity, Urine: 1.015 (ref 1.005–1.030)
Urobilinogen, UA: 0.2 mg/dL (ref 0.0–1.0)

## 2014-07-27 NOTE — Progress Notes (Signed)
Patient is Spanish-speaking only, Spanish interpreter present for this encounter. Postdates testing to start next week, IOL at 41 weeks if still pregnant No other complaints or concerns.  Labor and fetal movement precautions reviewed.

## 2014-07-27 NOTE — Patient Instructions (Signed)
Return to clinic for any obstetric concerns or go to [facility] for evaluation  

## 2014-07-27 NOTE — Progress Notes (Signed)
Used Interpreter MariaElena Jimenez 

## 2014-08-01 ENCOUNTER — Inpatient Hospital Stay (HOSPITAL_COMMUNITY): Payer: Medicaid Other | Admitting: Anesthesiology

## 2014-08-01 ENCOUNTER — Encounter (HOSPITAL_COMMUNITY): Payer: Self-pay | Admitting: Advanced Practice Midwife

## 2014-08-01 ENCOUNTER — Inpatient Hospital Stay (HOSPITAL_COMMUNITY)
Admission: AD | Admit: 2014-08-01 | Discharge: 2014-08-02 | DRG: 775 | Disposition: A | Discharge status: Home / Self Care | Payer: Medicaid Other | Source: Ambulatory Visit | Attending: Family Medicine | Admitting: Family Medicine

## 2014-08-01 DIAGNOSIS — O4290 Premature rupture of membranes, unspecified as to length of time between rupture and onset of labor, unspecified weeks of gestation: Secondary | ICD-10-CM | POA: Diagnosis present

## 2014-08-01 DIAGNOSIS — Z3A40 40 weeks gestation of pregnancy: Secondary | ICD-10-CM | POA: Diagnosis present

## 2014-08-01 DIAGNOSIS — O4292 Full-term premature rupture of membranes, unspecified as to length of time between rupture and onset of labor: Secondary | ICD-10-CM | POA: Diagnosis present

## 2014-08-01 DIAGNOSIS — O9989 Other specified diseases and conditions complicating pregnancy, childbirth and the puerperium: Secondary | ICD-10-CM | POA: Diagnosis present

## 2014-08-01 DIAGNOSIS — Z3A41 41 weeks gestation of pregnancy: Secondary | ICD-10-CM | POA: Diagnosis not present

## 2014-08-01 LAB — TYPE AND SCREEN
ABO/RH(D): O POS
Antibody Screen: NEGATIVE

## 2014-08-01 LAB — CBC
HCT: 40.9 % (ref 36.0–46.0)
HEMOGLOBIN: 13.9 g/dL (ref 12.0–15.0)
MCH: 28.2 pg (ref 26.0–34.0)
MCHC: 34 g/dL (ref 30.0–36.0)
MCV: 83 fL (ref 78.0–100.0)
PLATELETS: 184 10*3/uL (ref 150–400)
RBC: 4.93 MIL/uL (ref 3.87–5.11)
RDW: 14.3 % (ref 11.5–15.5)
WBC: 9.4 10*3/uL (ref 4.0–10.5)

## 2014-08-01 LAB — HIV ANTIBODY (ROUTINE TESTING W REFLEX): HIV Screen 4th Generation wRfx: NONREACTIVE

## 2014-08-01 LAB — ABO/RH: ABO/RH(D): O POS

## 2014-08-01 LAB — RPR: RPR: NONREACTIVE

## 2014-08-01 MED ORDER — ONDANSETRON HCL 4 MG/2ML IJ SOLN: 4.0000 mg | INTRAMUSCULAR | Status: DC | PRN

## 2014-08-01 MED ORDER — MISOPROSTOL 200 MCG PO TABS
ORAL_TABLET | ORAL | Status: AC
  Filled 2014-08-01: qty 5

## 2014-08-01 MED ORDER — BISACODYL 10 MG RE SUPP: 10.0000 mg | Freq: Every day | RECTAL | Status: DC | PRN

## 2014-08-01 MED ORDER — OXYTOCIN 40 UNITS IN LACTATED RINGERS INFUSION - SIMPLE MED: 62.5000 mL/h | INTRAVENOUS | Status: DC | PRN

## 2014-08-01 MED ORDER — OXYCODONE-ACETAMINOPHEN 5-325 MG PO TABS
1.0000 | ORAL_TABLET | ORAL | Status: DC | PRN
  Administered 2014-08-02: 1 via ORAL
  Filled 2014-08-01: qty 1

## 2014-08-01 MED ORDER — TETANUS-DIPHTH-ACELL PERTUSSIS 5-2.5-18.5 LF-MCG/0.5 IM SUSP: 0.5000 mL | Freq: Once | INTRAMUSCULAR | Status: DC

## 2014-08-01 MED ORDER — ONDANSETRON HCL 4 MG/2ML IJ SOLN
4.0000 mg | Freq: Four times a day (QID) | INTRAMUSCULAR | Status: DC | PRN
  Administered 2014-08-01: 4 mg via INTRAVENOUS
  Filled 2014-08-01: qty 2

## 2014-08-01 MED ORDER — ONDANSETRON HCL 4 MG PO TABS: 4.0000 mg | ORAL_TABLET | ORAL | Status: DC | PRN

## 2014-08-01 MED ORDER — OXYCODONE-ACETAMINOPHEN 5-325 MG PO TABS: 2.0000 | ORAL_TABLET | ORAL | Status: DC | PRN

## 2014-08-01 MED ORDER — LACTATED RINGERS IV SOLN
INTRAVENOUS | Status: DC
  Administered 2014-08-01 (×2): via INTRAVENOUS

## 2014-08-01 MED ORDER — MEASLES, MUMPS & RUBELLA VAC ~~LOC~~ INJ
0.5000 mL | INJECTION | Freq: Once | SUBCUTANEOUS | Status: DC
  Filled 2014-08-01: qty 0.5

## 2014-08-01 MED ORDER — SENNOSIDES-DOCUSATE SODIUM 8.6-50 MG PO TABS
2.0000 | ORAL_TABLET | ORAL | Status: DC
  Administered 2014-08-02: 2 via ORAL
  Filled 2014-08-01: qty 2

## 2014-08-01 MED ORDER — DIBUCAINE 1 % RE OINT: 1.0000 "application " | TOPICAL_OINTMENT | RECTAL | Status: DC | PRN

## 2014-08-01 MED ORDER — LANOLIN HYDROUS EX OINT: TOPICAL_OINTMENT | CUTANEOUS | Status: DC | PRN

## 2014-08-01 MED ORDER — FENTANYL CITRATE 0.05 MG/ML IJ SOLN
100.0000 ug | INTRAMUSCULAR | Status: DC | PRN
  Administered 2014-08-01: 100 ug via INTRAVENOUS
  Filled 2014-08-01: qty 2

## 2014-08-01 MED ORDER — IBUPROFEN 600 MG PO TABS
600.0000 mg | ORAL_TABLET | Freq: Four times a day (QID) | ORAL | Status: DC
  Administered 2014-08-02 (×4): 600 mg via ORAL
  Filled 2014-08-01 (×4): qty 1

## 2014-08-01 MED ORDER — FENTANYL 2.5 MCG/ML BUPIVACAINE 1/10 % EPIDURAL INFUSION (WH - ANES)
14.0000 mL/h | INTRAMUSCULAR | Status: DC | PRN
  Filled 2014-08-01: qty 125

## 2014-08-01 MED ORDER — FLEET ENEMA 7-19 GM/118ML RE ENEM: 1.0000 | ENEMA | Freq: Every day | RECTAL | Status: DC | PRN

## 2014-08-01 MED ORDER — EPHEDRINE 5 MG/ML INJ
10.0000 mg | INTRAVENOUS | Status: DC | PRN
  Filled 2014-08-01: qty 2

## 2014-08-01 MED ORDER — WITCH HAZEL-GLYCERIN EX PADS: 1.0000 "application " | MEDICATED_PAD | CUTANEOUS | Status: DC | PRN

## 2014-08-01 MED ORDER — OXYTOCIN 40 UNITS IN LACTATED RINGERS INFUSION - SIMPLE MED
1.0000 m[IU]/min | INTRAVENOUS | Status: DC
  Administered 2014-08-01: 2 m[IU]/min via INTRAVENOUS

## 2014-08-01 MED ORDER — OXYCODONE-ACETAMINOPHEN 5-325 MG PO TABS: 1.0000 | ORAL_TABLET | ORAL | Status: DC | PRN

## 2014-08-01 MED ORDER — ACETAMINOPHEN 325 MG PO TABS
650.0000 mg | ORAL_TABLET | ORAL | Status: DC | PRN
  Administered 2014-08-01: 650 mg via ORAL
  Filled 2014-08-01: qty 2

## 2014-08-01 MED ORDER — SODIUM CHLORIDE 0.9 % IJ SOLN: 3.0000 mL | INTRAMUSCULAR | Status: DC | PRN

## 2014-08-01 MED ORDER — LACTATED RINGERS IV SOLN: 500.0000 mL | Freq: Once | INTRAVENOUS | Status: DC

## 2014-08-01 MED ORDER — SODIUM CHLORIDE 0.9 % IV SOLN: 250.0000 mL | INTRAVENOUS | Status: DC | PRN

## 2014-08-01 MED ORDER — TERBUTALINE SULFATE 1 MG/ML IJ SOLN
0.2500 mg | Freq: Once | INTRAMUSCULAR | Status: DC | PRN
  Filled 2014-08-01: qty 1

## 2014-08-01 MED ORDER — LIDOCAINE HCL (PF) 1 % IJ SOLN
30.0000 mL | INTRAMUSCULAR | Status: DC | PRN
  Filled 2014-08-01: qty 30

## 2014-08-01 MED ORDER — LIDOCAINE HCL (PF) 1 % IJ SOLN
INTRAMUSCULAR | Status: DC | PRN
  Administered 2014-08-01: 4 mL
  Administered 2014-08-01: 3 mL

## 2014-08-01 MED ORDER — ACETAMINOPHEN 325 MG PO TABS: 650.0000 mg | ORAL_TABLET | ORAL | Status: DC | PRN

## 2014-08-01 MED ORDER — PHENYLEPHRINE 40 MCG/ML (10ML) SYRINGE FOR IV PUSH (FOR BLOOD PRESSURE SUPPORT)
80.0000 ug | PREFILLED_SYRINGE | INTRAVENOUS | Status: AC | PRN
  Administered 2014-08-01 (×3): 80 ug via INTRAVENOUS
  Filled 2014-08-01 (×2): qty 20

## 2014-08-01 MED ORDER — DIPHENHYDRAMINE HCL 25 MG PO CAPS: 25.0000 mg | ORAL_CAPSULE | Freq: Four times a day (QID) | ORAL | Status: DC | PRN

## 2014-08-01 MED ORDER — SIMETHICONE 80 MG PO CHEW: 80.0000 mg | CHEWABLE_TABLET | ORAL | Status: DC | PRN

## 2014-08-01 MED ORDER — PHENYLEPHRINE 40 MCG/ML (10ML) SYRINGE FOR IV PUSH (FOR BLOOD PRESSURE SUPPORT)
80.0000 ug | PREFILLED_SYRINGE | INTRAVENOUS | Status: AC | PRN
  Administered 2014-08-01 (×3): 80 ug via INTRAVENOUS

## 2014-08-01 MED ORDER — SODIUM CHLORIDE 0.9 % IJ SOLN: 3.0000 mL | Freq: Two times a day (BID) | INTRAMUSCULAR | Status: DC

## 2014-08-01 MED ORDER — PRENATAL MULTIVITAMIN CH
1.0000 | ORAL_TABLET | Freq: Every day | ORAL | Status: DC
  Administered 2014-08-02: 1 via ORAL
  Filled 2014-08-01: qty 1

## 2014-08-01 MED ORDER — CITRIC ACID-SODIUM CITRATE 334-500 MG/5ML PO SOLN: 30.0000 mL | ORAL | Status: DC | PRN

## 2014-08-01 MED ORDER — OXYTOCIN 40 UNITS IN LACTATED RINGERS INFUSION - SIMPLE MED: 1.0000 m[IU]/min | INTRAVENOUS | Status: DC

## 2014-08-01 MED ORDER — FENTANYL 2.5 MCG/ML BUPIVACAINE 1/10 % EPIDURAL INFUSION (WH - ANES)
INTRAMUSCULAR | Status: DC | PRN
  Administered 2014-08-01: 11 mL/h via EPIDURAL

## 2014-08-01 MED ORDER — ZOLPIDEM TARTRATE 5 MG PO TABS: 5.0000 mg | ORAL_TABLET | Freq: Every evening | ORAL | Status: DC | PRN

## 2014-08-01 MED ORDER — OXYTOCIN BOLUS FROM INFUSION
500.0000 mL | INTRAVENOUS | Status: DC
  Administered 2014-08-01: 500 mL via INTRAVENOUS

## 2014-08-01 MED ORDER — LACTATED RINGERS IV SOLN
500.0000 mL | INTRAVENOUS | Status: DC | PRN
  Administered 2014-08-01 (×4): 500 mL via INTRAVENOUS

## 2014-08-01 MED ORDER — OXYTOCIN 40 UNITS IN LACTATED RINGERS INFUSION - SIMPLE MED
62.5000 mL/h | INTRAVENOUS | Status: DC
  Filled 2014-08-01: qty 1000

## 2014-08-01 MED ORDER — BENZOCAINE-MENTHOL 20-0.5 % EX AERO: 1.0000 "application " | INHALATION_SPRAY | CUTANEOUS | Status: DC | PRN

## 2014-08-01 MED ORDER — EPHEDRINE 5 MG/ML INJ
10.0000 mg | INTRAVENOUS | Status: DC | PRN
Start: 2014-08-01 — End: 2014-08-01
  Filled 2014-08-01: qty 2

## 2014-08-01 MED ORDER — DIPHENHYDRAMINE HCL 50 MG/ML IJ SOLN: 12.5000 mg | INTRAMUSCULAR | Status: DC | PRN

## 2014-08-01 NOTE — Anesthesia Preprocedure Evaluation (Addendum)
Anesthesia Evaluation  Patient identified by MRN, date of birth, ID band Patient awake    Reviewed: Allergy & Precautions  Airway Mallampati: III  TM Distance: >3 FB Neck ROM: Full    Dental no notable dental hx. (+) Teeth Intact   Pulmonary asthma ,  breath sounds clear to auscultation  Pulmonary exam normal       Cardiovascular negative cardio ROS  Rhythm:Regular Rate:Normal     Neuro/Psych negative neurological ROS  negative psych ROS   GI/Hepatic negative GI ROS, Neg liver ROS,   Endo/Other  Obesity  Renal/GU negative Renal ROS  negative genitourinary   Musculoskeletal negative musculoskeletal ROS (+)   Abdominal (+) + obese,   Peds  Hematology negative hematology ROS (+)   Anesthesia Other Findings   Reproductive/Obstetrics (+) Pregnancy                            Anesthesia Physical Anesthesia Plan  ASA: II  Anesthesia Plan: Epidural   Post-op Pain Management:    Induction:   Airway Management Planned: Natural Airway  Additional Equipment:   Intra-op Plan:   Post-operative Plan:   Informed Consent: I have reviewed the patients History and Physical, chart, labs and discussed the procedure including the risks, benefits and alternatives for the proposed anesthesia with the patient or authorized representative who has indicated his/her understanding and acceptance.     Plan Discussed with: Anesthesiologist  Anesthesia Plan Comments:         Anesthesia Quick Evaluation

## 2014-08-01 NOTE — H&P (Signed)
LABOR ADMISSION HISTORY AND PHYSICAL  Kristy SievertYairy Ogando De Evans is a 29 y.o. female [phone] with IUP at 4176w1d by U/S presenting for suspected SROM. She reports LOF that started at 0430 and persisted until she arrived at  MAU, +FMs, no VB, no blurry vision, headaches or peripheral edema, and RUQ pain.  She plans on breast/bottle feeding. She request  Depo for birth control.  Dating: By U/S --->  Estimated Date of Delivery: 07/31/14  Sono:    @[redacted]w[redacted]d , CWD, normal anatomy, cepahlic presentation,  2840g, 80%ile EFW   Prenatal History/Complications:  [facility]  Dating [place] Blood type: O/Positive/-- (10/07 0000)   Genetic Screen 1 Screen:    AFP:     Quad:     NIPS:late Antibody:Negative (10/07 0000)  Anatomic US normal Rubella:    GTT Early:               Third trimester: 81 RPR: NON REAC (02/01 1015)   Flu vaccine In DR HBsAg: Negative (10/07 0000)   TDaP vaccine 06/15/14          HIV: Non-reactive (10/07 0000)   GBS Negative                                     GBS: Negative  Contraception Depo  Pap:  Baby Food Breast/bottle   Circumcision Boy - yes   Pediatrician Undecided   Support Person Alinda Moneyony     Past Medical History: Past Medical History  Diagnosis Date  . Asthma     Past Surgical History: Past Surgical History  Procedure Laterality Date  . Breast reduction surgery    . Right oophorectomy    . Abdominoplasty      Obstetrical History: OB History    Gravida Para Term Preterm AB TAB SAB Ectopic Multiple Living   3 1 0 1 1 1  0   2      Social History: History   Social History  . Marital Status: Married    Spouse Name: N/A  . Number of Children: N/A  . Years of Education: N/A   Social History Main Topics  . Smoking status: Never Smoker   . Smokeless tobacco: Never Used  . Alcohol Use: No  . Drug Use: No  . Sexual Activity: Yes    Birth Control/ Protection: None   Other Topics Concern  . Not on file   Social History  Narrative    Family History: No family history on file.  Allergies: No Known Allergies  Prescriptions prior to admission  Medication Sig Dispense Refill Last Dose  . aspirin EC 81 MG tablet Take 1 tablet (81 mg total) by mouth daily. 90 tablet 1 Taking  . Prenatal Vit-Fe Fumarate-FA (PRENATAL VITAMINS PLUS PO) Take 1 tablet by mouth.   Taking     Review of Systems   All systems reviewed and negative except as stated in HPI  Blood pressure 125/89, pulse 86, temperature 97.9 F (36.6 C), resp. rate 20, height 4\' 9"  (1.448 m), weight 64.864 kg (143 lb). General appearance: alert and cooperative Lungs: clear to auscultation bilaterally Heart: regular rate and rhythm Abdomen: soft, non-tender; bowel sounds normal Extremities: Homans sign is negative, no sign of DVT DTR's 2+ Presentation: cephalic Fetal monitoringBaseline: 125 bpm, Variability: Good {> 6 bpm), Accelerations: Reactive and Decelerations: Absent Uterine activityDate/time of onset: today 04/09 around 0500 and  Frequency: Every 3-4 minutes Dilation: 1 Effacement (%): 70 Station: -2 Exam by:: L.Mears,Rn   Prenatal labs: ABO, Rh: O/Positive/-- (10/07 0000) Antibody: Negative (10/07 0000) Rubella: 0.49 (02/01 1015) RPR: NON REAC (02/01 1015)  HBsAg: Negative (10/07 0000)  HIV: Non-reactive (10/07 0000)  GBS: Negative (03/14 0000)  1 hr Glucola normal (81) Genetic screening  Not performed  Anatomy US normal   Prenatal Transfer Tool  Maternal Diabetes: No Genetic Screening: not performed   Maternal Ultrasounds/Referrals: Normal Fetal Ultrasounds or other Referrals:  None Maternal Substance Abuse:  No Significant Maternal Medications:  None Significant Maternal Lab Results: None  No results found for this or any previous visit (from the past 24 hour(s)).  Patient Active Problem List   Diagnosis Date Noted  . Amniotic fluid leaking 08/01/2014  . Decreased fetal movement   . Poor weight gain of pregnancy    . Prior poor obstetrical history in third trimester, antepartum   . 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 Supervision of high risk pregnancy in third trimester 05/25/2014  . Insufficient prenatal care in third trimester 05/25/2014  . Hx of preeclampsia, prior pregnancy, currently pregnant 05/25/2014    Assessment: Kristy Evans is a 29 y.o. [id number] at [redacted]w[redacted]d here for suspected SROM.   #Labor: latent; expectant management  #Pain: plans on epidural  #FWB: Category I #ID: GBS negative  #MOF: breast/bottle  #MOC: Depo  #Circ: yes   Kristy Evans 08/01/2014, 5:53 AM   CNM attestation:  I have seen and examined this patient; I agree with above documentation in the medical student's note.   Kristy Evans is a 29 y.o. [phone] here for PROM  PE: BP 133/75 mmHg  Pulse 80  Temp(Src) 97.9 F (36.6 C)  Resp 16  Ht  (1.448 m)  Wt 64.864 kg (143 lb)  BMI 30.94 kg/m2 Gen: calm comfortable, NAD Resp: normal effort, no distress Abd: gravid  ROS, labs, PMH reviewed FHR + accels, no decels  Plan: Admit to [facility] Expectant management  Cam Hai CNM 08/01/2014, 9:11 AM

## 2014-08-01 NOTE — MAU Note (Signed)
Leakig fld for . Clear with white pieces. No contractions. Small amt bloody show.

## 2014-08-01 NOTE — Progress Notes (Signed)
Patient ID: Kristy Evans, female   DOB: [birth year], 29 y.o.   MRN: [id number] Kristy Evans is a 29 y.o. [phone] at 7949w1d admitted for PROM  Subjective: Comfortable w/ epidural, no complaints  Objective: BP 106/68 mmHg  Pulse 92  Temp(Src) 98.7 F (37.1 C) (Oral)  Resp 18  Ht 4\' 9"  (1.448 m)  Wt 64.864 kg (143 lb)  BMI 30.94 kg/m2  SpO2 100%    FHT:  FHR: 135 bpm, variability: moderate,  accelerations:  Present,  decelerations:  Present earlies and mild variables UC:   regular, every 2-3 minutes  SVE:   Dilation: 4.5 Effacement (%): 80 Station: -1 Exam by:: J.Thornton, RN  Pitocin @ 16 mu/min  Labs: Lab Results  Component Value Date   WBC 9.4 08/01/2014   HGB 13.9 08/01/2014   HCT 40.9 08/01/2014   MCV 83.0 08/01/2014   PLT 184 08/01/2014    Assessment / Plan: Augmentation of labor, progressing well  Labor: Progressing normally Fetal Wellbeing:  Category II Pain Control:  Epidural Pre-eclampsia: n/a I/D:  n/a Anticipated MOD:  NSVD  Marge DuncansBooker, Kimberly Randall CNM, WHNP-BC 08/01/2014, 5:37 PM

## 2014-08-01 NOTE — Progress Notes (Signed)
Spanish interpretor at bedside for epidural placement

## 2014-08-01 NOTE — Progress Notes (Signed)
Patient ID: Donell SievertYairy Ogando De Campbell, female   DOB: 10-Mar-1986, 29 y.o.   MRN: [id number] Donell SievertYairy Ogando De Campbell is a 29 y.o. [phone] at 2962w1d admitted for SROM/early labor  Subjective: Feels like uc's spacing out, no complaints  Objective: BP 108/65 mmHg  Pulse 83  Temp(Src) 98.1 F (36.7 C) (Oral)  Resp 16  Ht 4\' 9"  (1.448 m)  Wt 64.864 kg (143 lb)  BMI 30.94 kg/m2    FHT:  FHR: 125 bpm, variability: moderate,  accelerations:  Present,  decelerations:  Absent UC:   irregular  SVE:   Dilation: 3 Effacement (%): 70 Station: -2 Exam by:: J.THornton, RN Vtx  Labs: Lab Results  Component Value Date   WBC 9.4 08/01/2014   HGB 13.9 08/01/2014   HCT 40.9 08/01/2014   MCV 83.0 08/01/2014   PLT 184 08/01/2014    Assessment / Plan: SROM 0430 w/early labor, uc's spacing out- will start pitocin per protocol  Labor: early Fetal Wellbeing:  Category I Pain Control:  n/a, plans for epidural Pre-eclampsia: n/a I/D:  n/a Anticipated MOD:  NSVD  Marge DuncansBooker, Colisha Redler Randall CNM, WHNP-BC 08/01/2014, 11:00 AM

## 2014-08-01 NOTE — Anesthesia Procedure Notes (Signed)
Epidural Patient location during procedure: OB Start time: 08/01/2014 12:55 PM  Staffing Anesthesiologist: Mal AmabileFOSTER, Fady Stamps Performed by: anesthesiologist   Preanesthetic Checklist Completed: patient identified, site marked, surgical consent, pre-op evaluation, timeout performed, IV checked, risks and benefits discussed and monitors and equipment checked  Epidural Patient position: sitting Prep: site prepped and draped and DuraPrep Patient monitoring: continuous pulse ox and blood pressure Approach: midline Location: L3-L4 Injection technique: LOR air  Needle:  Needle type: Tuohy  Needle gauge: 17 G Needle length: 9 cm and 9 Needle insertion depth: 4 cm Catheter type: closed end flexible Catheter size: 19 Gauge Catheter at skin depth: 9 cm Test dose: negative and Other  Assessment Events: blood not aspirated, injection not painful, no injection resistance, negative IV test and no paresthesia  Additional Notes Patient identified. Risks and benefits discussed including failed block, incomplete  Pain control, post dural puncture headache, nerve damage, paralysis, blood pressure Changes, nausea, vomiting, reactions to medications-both toxic and allergic and post Partum back pain. All questions were answered. Patient expressed understanding and wished to proceed. Sterile technique was used throughout procedure. Epidural site was Dressed with sterile barrier dressing. No paresthesias, signs of intravascular injection Or signs of intrathecal spread were encountered.  Patient was more comfortable after the epidural was dosed. Please see RN's note for documentation of vital signs and FHR which are stable.

## 2014-08-01 NOTE — Progress Notes (Signed)
[facility] for further eval of SROM. Pt put on gown in Triage due to clothes being wet. Leaked cl fld on floor.

## 2014-08-02 LAB — RUBELLA SCREEN: Rubella: 18.2 index (ref >0.99)

## 2014-08-02 MED ORDER — IBUPROFEN 600 MG PO TABS: 600.0000 mg | ORAL_TABLET | Freq: Four times a day (QID) | ORAL | Status: DC | PRN

## 2014-08-02 NOTE — Lactation Note (Signed)
This note was copied from the chart of Kristy Evans. Lactation Consultation Note  Patient Name: Kristy Evans UJWJX'BToday's Date: 08/02/2014 Reason for consult: Follow-up assessment;Difficult latch;Breast surgery (breast reduction in 2014) LC assisted with maintaining latch after RN, Brook had assisted her and when baby slipped off briefly, is able to re-latch quickly to (L) breast in football position.  Mom has compressible breasts and audible swallows and expressible colostrum noted. Rhythmical sucking observed for >7 minutes and [facility] interpreter present to translate all information concerning signs of proper latch and milk transfer, supply and demand for milk production, Breastfeeding After Reduction Surgery resource on Resource List from [facility], as well as [facility] website for information in Spanish.  LC encouraged mom to feed on cue and discussed rapid digestion of breast milk which can lead to cluster feedings.   Maternal Data Formula Feeding for Exclusion: Yes Reason for exclusion: Mother's choice to formula and breast feed on admission (mom informed other LC, MaryAnn that she does not want to pump)  Feeding Feeding Type: Breast Fed Length of feed: 60 min  LATCH Score/Interventions Latch: Grasps breast easily, tongue down, lips flanged, rhythmical sucking. (brief latch assistance needed but then rhythmical sucking bursts noted) Intervention(s): Assist with latch;Breast compression  Audible Swallowing: Spontaneous and intermittent Intervention(s): Skin to skin;Hand expression Intervention(s): Skin to skin;Hand expression;Alternate breast massage  Type of Nipple: Everted at rest and after stimulation (short shaft nipples) Intervention(s): No intervention needed  Comfort (Breast/Nipple): Soft / non-tender     Hold (Positioning): Assistance needed to correctly position infant at breast and maintain latch. Intervention(s): Breastfeeding basics reviewed;Support  Pillows;Position options;Skin to skin  LATCH Score: 9 (LC observed and briefly assisted; mom encouraged to hold baby close, with cheeks, nose, chin touching breast throughout feedings)  Lactation Tools Discussed/Used   Latch techniques, hand expression, signs of proper latch and milk transfer Cue feedings and supply and demand; cluster feedings at breast  Consult Status Consult Status: Follow-up Date: 08/03/14 Follow-up type: In-patient    Warrick ParisianBryant, Ryley Bachtel [facility] 08/02/2014, 4:18 PM

## 2014-08-02 NOTE — Progress Notes (Signed)
Assisted RN with interpretation of admitting patient into unit.  Spanish interpreter

## 2014-08-02 NOTE — Progress Notes (Signed)
Assisted Rn with interpretation of discharge instructions for mother.  Spanish Interpreter

## 2014-08-02 NOTE — Progress Notes (Signed)
Checked on patient needs  °Spanish Interpreter °

## 2014-08-02 NOTE — Anesthesia Postprocedure Evaluation (Signed)
Anesthesia Post Note  Patient: Kristy SievertYairy Ogando De Evans  Procedure(s) Performed: * No procedures listed *  Anesthesia type: Epidural  Patient location: Mother/Baby  Post pain: Pain level controlled  Post assessment: Post-op Vital signs reviewed  Last Vitals:  Filed Vitals:   08/02/14 0155  BP: 118/67  Pulse: 102  Temp: 36.6 C  Resp: 18    Post vital signs: Reviewed  Level of consciousness:alert  Complications: No apparent anesthesia complications

## 2014-08-02 NOTE — Progress Notes (Signed)
Assisted RN with interpretation of baby assessment.  °Spanish Interpreter  °

## 2014-08-02 NOTE — Progress Notes (Signed)
Assisted RN with medication questions.  Spanish Interpreter

## 2014-08-02 NOTE — Progress Notes (Signed)
Assisted RN with interpretation of RN shift change and ordered late snack and breakfast. Spanish interpreter

## 2014-08-02 NOTE — Discharge Summary (Signed)
Obstetric Discharge Summary Reason for Admission: rupture of membranes Prenatal Procedures: ultrasound Intrapartum Procedures: spontaneous vaginal delivery Postpartum Procedures: none Complications-Operative and Postpartum: none Eating, drinking, voiding, ambulating well.  +flatus.  Lochia and pain wnl.  Denies dizziness, lightheadedness, or sob. No complaints.   Hospital Course: Kristy SievertYairy Ogando De Evans is a 29 y.o. [phone] female admited at 5540.1wks w/ prom/early labor, she required pitocin augmentation then progressed normally to uncomplicated NSVB . Her pp course has been uncomplicated.  By PPD#1 she is doing well, requests early d/c, and is deemed to have received the full benefit of her hospital stay.  Filed Vitals:   08/02/14 0155  BP: 118/67  Pulse: 102  Temp: 97.8 F (36.6 C)  Resp: 18   H/H: Lab Results  Component Value Date/Time   HGB 13.9 08/01/2014 05:50 AM   HGB 13.8 01/28/2014   HCT 40.9 08/01/2014 05:50 AM   HCT 40 01/28/2014    Physical Exam: General: alert, cooperative and no distress Abdomen/Uterine Fundus: Appropriately tender, non-distended, FF @ U-2 Incision: n/a Lochia: appropriate Extremities: No evidence of DVT seen on physical exam. Negative Homan's sign, no cords, calf tenderness, or significant calf/ankle edema   Discharge Diagnoses: Term Pregnancy-delivered  Discharge Information: Date: 11/03/2010 Activity: pelvic rest Diet: routine  Medications: PNV and Ibuprofen Breast feeding: Yes Contraception: plans for depo Circumcision: outpatient Condition: stable Instructions: refer to handout Discharge to: home  Infant: Home with mother  Follow-up Information    [facility]   Specialty:  Obstetrics and Gynecology   Why:  4-6 weeks for your postpartum visit   Contact information:   [address] [place] [id number] [phone]      Kristy DuncansBooker, Kristy Evans, CNM, WHNP-BC 08/02/2014,7:19  AM

## 2014-08-02 NOTE — Progress Notes (Signed)
Checked on patients needs.  °Spanish Interpreter  °

## 2014-08-02 NOTE — Lactation Note (Signed)
This note was copied from the chart of Kristy Evans. Lactation Consultation Note  Mom successfully BF her older child for over 2 years but had breast reduction surgery in 2014.  She is concerned that she has "no milk" but I was easily able to hand express colostrum.  I recommended that she use a double electric breast pump after breast feeding to optimize supply.  She stated desire to feed the baby both breast milk and formula and that she feels like a cow when she uses a double electric breast pump.  She stated that now that she knows she has colostrum she will work more on BF.  The baby was sound asleep when I was in the room and had just eaten an hour ago.  Mom is to page for assistance with the next feeding so a plan can be determined. I did encourage her to offer a supplement to the baby after BF because of the breast reduction. Patient Name: Kristy Evans Today's Date: 08/02/2014     Maternal Data    Feeding    LATCH Score/Interventions                      Lactation Tools Discussed/Used     Consult Status      Soyla DryerJoseph, Lavonia Eager 08/02/2014, 3:45 PM

## 2014-08-02 NOTE — Progress Notes (Signed)
Was present for interpretation of delivery instructions.  Spanish Interpreter

## 2014-08-02 NOTE — Discharge Instructions (Signed)
Cuidados en el postparto luego de un parto vaginal  (Postpartum Care After Vaginal Delivery) Despus del parto (perodo de postparto), la estada normal en el hospital es de 24-72 horas. Si hubo problemas con el trabajo de parto o el parto, o si tiene otros problemas mdicos, es posible que Patent attorney en el hospital por ms [place].  Mientras est en el hospital, recibir [place] e instrucciones sobre cmo cuidar de usted misma y de su beb recin nacido durante el postparto.  Mientras est en el hospital:   Asegrese de decirle a las enfermeras si siente dolor o Tree surgeon, as como donde Designer, television/film set y Architect.  Si usted tuvo una incisin cerca de la vagina (episiotoma) o si ha tenido Education officer, museum, las enfermeras le pondrn hielo sobre la episiotoma o Psychiatrist. Las bolsas de hielo pueden ayudar a Dietitian y la hinchazn.  Si est amamantando, puede sentir contracciones dolorosas en el tero durante algunas semanas. Esto es normal. Las contracciones ayudan a que el tero vuelva a su tamao normal.  Es normal tener algo de sangrado despus del Placedo.  Durante los primeros 1-3 das despus del parto, el flujo es de color rojo y la cantidad puede ser similar a un perodo.  Es frecuente que el flujo se inicie y se Production assistant, radio.  En los primeros [place], puede eliminar algunos cogulos pequeos. Informe a las enfermeras si elimina cogulos grandes o aumenta el flujo.  No  elimine los cogulos de sangre por el inodoro antes de que la [employer] vea.  Durante los prximos 3 a [address] despus del parto, el flujo debe ser ms acuoso y rosado o Forensic psychologist.  Chancy Hurter a catorce [employer] del parto, el flujo debe ser una pequea cantidad de secrecin de color blanco amarillento.  La cantidad de flujo disminuir en las primeras semanas despus del parto. El flujo puede detenerse en 6-8 semanas. La mayora de las mujeres no tienen ms flujo a las 12 semanas  despus del [place].  Usted debe cambiar sus apsitos con frecuencia.  Lvese bien las manos con agua y jabn durante al menos 20 segundos despus de cambiar el apsito, usar el bao o antes de Nature conservation officer o Research scientist (life sciences) a su recin nacido.  Usted podr sentir como que tiene que vaciar la vejiga durante las primeras 6-8 horas despus del [place].  En caso de que sienta debilidad, mareo o [place], llame a la enfermera antes de levantarse de la cama por primera vez y antes de tomar una ducha por primera vez.  Dentro de los [employer] del parto, sus mamas pueden comenzar a estar sensibles y [place]. Esto se llama congestin. La sensibilidad en los senos por lo general desaparece dentro de las 48-72 horas despus de que ocurre la congestin. Tambin puede notar que la [place] se escapa de sus senos. Si no est amamantando no estimule sus pechos. La estimulacin de las mamas hace que sus senos produzcan ms [place].  Pasar tanto tiempo como le sea posible con el beb recin nacido es muy importante. Durante ese tiempo, usted y su beb deben sentirse cerca y conocerse uno al otro. Tener al beb en su habitacin (alojamiento conjunto) ayudar a fortalecer el vnculo con el beb recin nacido.Esto le dar tiempo para conocerlo y atenderlo de [place] cmoda.  Las hormonas se modifican despus del parto. A veces, los cambios hormonales pueden causar tristeza o ganas de llorar por un tiempo. Estos sentimientos  no deben durar ms de Hughes Supplyunos pocos das. Si duran ms que eso, debe hablar con su mdico.  Si lo desea, hable con su mdico acerca de los mtodos de planificacin familiar o mtodos anticonceptivos.  Hable con su mdico acerca de las vacunas. El mdico puede indicarle que se aplique las siguientes vacunas antes de salir del hospital:  [place] and PrincipeVacuna contra el ttanos, la difteria y la tos ferina (Tdap) o el ttanos y la difteria (Td). Es muy importante que usted y su familia (incluyendo a los abuelos) u otras  personas que cuidan al recin nacido estn al da con las vacunas Tdap o Td. Las vacunas Tdap o Td pueden ayudar a proteger al recin nacido de enfermedades.  Inmunizacin contra la rubola.  Inmunizacin contra la varicela.  Inmunizacin contra la gripe. Usted debe recibir esta vacunacin anual si no la ha recibido Academic librariandurante el embarazo. Document Released: 02/05/2007 Document Revised: 01/03/2012 Operating Room ServicesExitCare Patient Information 2015 West FargoExitCare, MarylandLLC. This information is not intended to replace advice given to you by your health care provider. Make sure you discuss any questions you have with your health care provider.  Lactancia materna (Breastfeeding) Decidir Museum/gallery exhibitions officeramamantar es una de las mejores elecciones que puede hacer por usted y su beb. El cambio hormonal durante el Psychiatristembarazo produce el desarrollo del tejido mamario y Lesothoaumenta la cantidad y el tamao de los conductos galactforos. Estas hormonas tambin permiten que las protenas, los azcares y las grasas de la sangre produzcan la WPS Resourcesleche materna en las glndulas productoras de Hamletleche. Las hormonas impiden que la leche materna sea liberada antes del nacimiento del beb, adems de impulsar el flujo de leche luego del nacimiento. Una vez que ha comenzado a Museum/gallery exhibitions officeramamantar, Conservation officer, naturepensar en el beb, as Immunologistcomo la succin o Theatre managerel llanto, pueden estimular la liberacin de Grissom AFBleche de las glndulas productoras de Ingallsleche.  LOS BENEFICIOS DE AMAMANTAR Para el beb  La primera leche (calostro) ayuda a Careers information officermejorar el funcionamiento del sistema digestivo del beb.  La leche tiene anticuerpos que ayudan a Radio producerprevenir las infecciones en el beb.  El beb tiene una menor incidencia de asma, alergias y del sndrome de muerte sbita del lactante.  Los nutrientes en la Nicoma Parkleche materna son mejores para el beb que la Deversleche maternizada y estn preparados exclusivamente para cubrir las necesidades del beb.  La leche materna mejora el desarrollo cerebral del beb.  Es menos probable que el beb  desarrolle otras enfermedades, como obesidad infantil, asma o diabetes mellitus de tipo 2. Para usted   La lactancia materna favorece el desarrollo de un vnculo muy especial entre la madre y el beb.  Es conveniente. La leche materna siempre est disponible a la Human resources officertemperatura correcta y es Marysvilleeconmica.  La lactancia materna ayuda a quemar caloras y a perder el peso ganado durante el New Egyptembarazo.  Favorece la contraccin del tero al tamao que tena antes del embarazo de manera ms rpida y disminuye el sangrado (loquios) despus del parto.  La lactancia materna contribuye a reducir Nurse, adultel riesgo de desarrollar diabetes mellitus de tipo 2, osteoporosis o cncer de mama o de ovario en el futuro. SIGNOS DE QUE EL BEB EST HAMBRIENTO Primeros signos de [address]hambre  Aumenta su estado de Lesothoalerta o actividad.  Se estira.  Mueve la cabeza de un lado a otro.  Mueve la cabeza y abre la boca cuando se le toca la mejilla o la comisura de la boca (reflejo de bsqueda).  Aumenta las vocalizaciones, tales como sonidos de succin, se relame los labios,  emite arrullos, suspiros, o chirridos.  Mueve la Jones Apparel Groupmano hacia la boca.  Se chupa con ganas los dedos o las manos. Signos tardos de Fisher Scientifichambre  Est agitado.  Llora de manera intermitente. Signos de AES Corporationhambre extrema Los signos de hambre extrema requerirn que lo calme y lo consuele antes de que el beb pueda alimentarse adecuadamente. No espere a que se manifiesten los siguientes signos de hambre extrema para comenzar a Museum/gallery exhibitions officeramamantar:   Designer, jewelleryAgitacin.  Llanto intenso y fuerte.   Gritos. INFORMACIN BSICA SOBRE LA LACTANCIA MATERNA Iniciacin de la lactancia materna  Encuentre un lugar cmodo para sentarse o acostarse, con un buen respaldo para el cuello y la espalda.  Coloque una almohada o una manta enrollada debajo del beb para acomodarlo a la altura de la mama (si est sentada). Las almohadas para Museum/gallery exhibitions officeramamantar se han diseado especialmente a fin de servir de apoyo  para los brazos y el beb Smithfield Foodsmientras amamanta.  Asegrese de que el abdomen del beb est frente al suyo.  Masajee suavemente la mama. Con las yemas de los dedos, masajee la pared del pecho hacia el pezn en un movimiento circular. Esto estimula el flujo de Crucibleleche. Es posible que Engineer, manufacturing systemsdeba continuar este movimiento mientras amamanta si la leche fluye lentamente.  Sostenga la mama con el pulgar por arriba del pezn y los otros 4 dedos por debajo de la mama. Asegrese de que los dedos se encuentren lejos del pezn y de la boca del beb.  Empuje suavemente los labios del beb con el pezn o con el dedo.  Cuando la boca del beb se abra lo suficiente, acrquelo rpidamente a la mama e introduzca todo el pezn y la zona oscura que lo rodea (areola), tanto como sea posible, dentro de la boca del beb.  Debe haber ms areola visible por arriba del labio superior del beb que por debajo del labio inferior.  La lengua del beb debe estar entre la enca inferior y la Texicomama.  Asegrese de que la boca del beb est en la posicin correcta alrededor del pezn (prendida). Los labios del beb deben crear un sello sobre la mama y estar doblados hacia afuera (invertidos).  Es comn que el beb succione durante 2 a 3 minutos para que comience el flujo de Landingvilleleche materna. Cmo debe prenderse Es muy importante que le ensee al beb cmo prenderse adecuadamente a la mama. Si el beb no se prende adecuadamente, puede causarle dolor en el pezn y reducir la produccin de Bakersfield Country Clubleche materna, y hacer que el beb tenga un escaso aumento de [place] Lake-Letopeso. Adems, si el beb no se prende adecuadamente al pezn, puede tragar aire durante la alimentacin. Esto puede causarle molestias al beb. Hacer eructar al beb al Pilar Platecambiar de mama puede ayudarlo a liberar el aire. Sin embargo, ensearle al beb cmo prenderse a la mama adecuadamente es la mejor manera de evitar que se sienta molesto por tragar Oceanographeraire mientras se alimenta. Signos de que el beb se  ha prendido adecuadamente al pezn:   Payton Doughtyironea o succiona de modo silencioso, sin causarle dolor.  Se escucha que traga cada 3 o 4 succiones.   Hay movimientos musculares por arriba y por delante de sus odos al Printmakersuccionar. Signos de que el beb no se ha prendido Audiological scientistadecuadamente al pezn:   Hace ruidos de succin o de chasquido mientras se alimenta.  Siente dolor en el pezn. Si cree que el beb no se prendi correctamente, deslice el dedo en la comisura de la boca y colquelo  entre las encas del beb para interrumpir la succin. Intente comenzar a amamantar nuevamente. Signos de Fish farm managerlactancia materna exitosa Signos del beb:   Disminuye gradualmente el nmero de succiones o cesa la succin por completo.  Se duerme.  Relaja el cuerpo.  Retiene una pequea cantidad de Kindred Healthcareleche en la boca.  Se desprende solo del pecho. Signos que presenta usted:  Las mamas han aumentado la firmeza, el peso y el tamao 1 a 3 horas despus de Museum/gallery exhibitions officeramamantar.  Estn ms blandas inmediatamente despus de amamantar.  Un aumento del volumen de McIntyreleche, y tambin un cambio en su consistencia y color se producen hacia el quinto da de Tour managerlactancia materna.  Los pezones no duelen, ni estn agrietados ni sangran. Signos de que su beb recibe la cantidad de leche suficiente  Moja al menos 3 paales en 24 horas. La orina debe ser clara y de color amarillo plido a los 5 [address]  [address]das de [place]vida.  Defeca al menos 3 veces en 24 horas a los [address]  [address] de [address]. La materia fecal debe ser blanda y Pajarito Mesaamarillenta.  Defeca al menos 3 veces en 24 horas a los [address] das de [address]. La materia fecal debe ser grumosa y Hato Candalamarillenta.  No registra una prdida de peso mayor del 10% del peso al nacer durante los primeros 3 [address]  [address]das de [place]vida.  Aumenta de peso un promedio de 4 a 7onzas (113 a 198g) por semana despus de los 4 [address]  [address] de vida.  Aumenta de Bradfordpeso, Flushingdiariamente, de Hazardmanera uniforme a Glass blower/designerpartir de los 5 [address]  [address] de vida, sin Passenger transport managerregistrar prdida de peso despus de las  2semanas de vida. Despus de alimentarse, es posible que el beb regurgite una pequea cantidad. Esto es frecuente. FRECUENCIA Y DURACIN DE LA LACTANCIA MATERNA El amamantamiento frecuente la ayudar a producir ms Azerbaijanleche y a Education officer, communityprevenir problemas de Engineer, miningdolor en los pezones e hinchazn en las Town Linemamas. Alimente al beb cuando muestre signos de hambre o si siente la necesidad de reducir la congestin de las [place] Hillmamas. Esto se denomina "lactancia a demanda". Evite el uso del chupete mientras trabaja para establecer la lactancia (las primeras 4 a 6 semanas despus del nacimiento del beb). Despus de este perodo, podr ofrecerle un chupete. Las investigaciones demostraron que el uso del chupete durante el primer ao de vida del beb disminuye el riesgo de desarrollar el sndrome de muerte sbita del lactante (SMSL). Permita que el nio se alimente en cada mama todo lo que desee. Contine amamantando al beb hasta que haya terminado de alimentarse. Cuando el beb se desprende o se queda dormido mientras se est alimentando de la primera mama, ofrzcale la segunda. Debido a que, con frecuencia, los recin Sunoconacidos permanecen somnolientos las primeras semanas de vida, es posible que deba despertar al beb para alimentarlo. Los horarios de Acupuncturistlactancia varan de un beb a otro. Sin embargo, las siguientes reglas pueden servir como gua para ayudarla a Lawyergarantizar que el beb se alimenta adecuadamente:  Se puede amamantar a los recin nacidos (bebs de 4 semanas o menos de vida) cada 1 a 3 horas.  No deben transcurrir ms de 3 horas durante el da o 5 horas durante la noche sin que se amamante a los recin nacidos.  Debe amamantar al beb 8 veces como mnimo en un perodo de 24 horas, hasta que comience a introducir slidos en su dieta, a los 6 meses de vida aproximadamente. EXTRACCIN DE Dean Foods CompanyLECHE MATERNA La extraccin y Contractorel almacenamiento de la leche materna le permiten asegurarse de que el  beb se alimente exclusivamente de  Colgate Palmoliveleche materna, aun en momentos en los que no puede amamantar. Esto tiene especial importancia si debe regresar al Aleen Campitrabajo en el perodo en que an est amamantando o si no puede estar presente en los momentos en que el beb debe alimentarse. Su asesor en lactancia puede orientarla sobre cunto tiempo es seguro almacenar Port Alexanderleche materna.  El sacaleche es un aparato que le permite extraer leche de la mama a un recipiente estril. Luego, la leche materna extrada puede almacenarse en un refrigerador o Electrical engineercongelador. Algunos sacaleches son Birdie Riddlemanuales, Delaney Meigsmientras que otros son elctricos. Consulte a su asesor en lactancia qu tipo ser ms conveniente para usted. Los sacaleches se pueden comprar; sin embargo, algunos hospitales y grupos de apoyo a la lactancia materna alquilan Sports coachsacaleches mensualmente. Un asesor en lactancia puede ensearle cmo extraer W. R. Berkleyleche materna manualmente, en caso de que prefiera no usar un sacaleche.  CMO CUIDAR LAS MAMAS DURANTE LA LACTANCIA MATERNA Los pezones se secan, agrietan y duelen durante la Tour managerlactancia materna. Las siguientes recomendaciones pueden ayudarla a Pharmacologistmantener las TEPPCO Partnersmamas humectadas y sanas:  Careers information officervite usar jabn en los pezones.  Use un sostn de soporte. Aunque no son esenciales, las camisetas sin mangas o los sostenes especiales para Museum/gallery exhibitions officeramamantar estn diseados para acceder fcilmente a las mamas, para Museum/gallery exhibitions officeramamantar sin tener que quitarse todo el sostn o la camiseta. Evite usar sostenes con aro o sostenes muy ajustados.  Seque al aire sus pezones durante 3 a 4minutos despus de amamantar al beb.  Utilice solo apsitos de Haematologistalgodn en el sostn para Environmental health practitionerabsorber las prdidas de Hudsonleche. La prdida de un poco de Public Service Enterprise Groupleche materna entre las tomas es normal.  Utilice lanolina sobre los pezones luego de Museum/gallery exhibitions officeramamantar. La lanolina ayuda a mantener la humedad normal de la piel. Si Botswanausa lanolina pura, no tiene que lavarse los pezones antes de volver a Corporate treasureralimentar al beb. La lanolina pura no es txica para el  beb. Adems, puede extraer Beazer Homesmanualmente algunas gotas de Cape Royaleleche materna y Engineer, maintenance (IT)masajear suavemente esa Winn-Dixieleche sobre los pezones, para que la Terltonleche se seque al aire. Durante las primeras semanas despus de dar a luz, algunas mujeres pueden experimentar hinchazn en las mamas (congestin Strandburgmamaria). La congestin puede hacer que sienta las mamas pesadas, calientes y sensibles al tacto. El pico de la congestin ocurre dentro de los 3 a 5 das despus del Hillsboroughparto. Las siguientes recomendaciones pueden ayudarla a Paramedicaliviar la congestin:  Vace por completo las mamas al QUALCOMMamamantar o Environmental health practitionerextraer leche. Puede aplicar calor hmedo en las mamas (en la ducha o con toallas hmedas para manos) antes de Museum/gallery exhibitions officeramamantar o extraer WPS Resourcesleche. Esto aumenta la circulacin y Saint Vincent and the Grenadinesayuda a que la Holyroodleche fluya. Si el beb no vaca por completo las [address]amas cuando lo [address] Aveamamanta, extraiga la Judsonialeche restante despus de que haya finalizado.  Use un sostn ajustado (para amamantar o comn) o una camiseta sin mangas durante 1 o 2 das para indicar al cuerpo que disminuya ligeramente la produccin de Olneyleche.  Aplique compresas de hielo Yahoo! Incsobre las mamas, a menos que le resulte demasiado incmodo.  Asegrese de que el beb est prendido y se encuentre en la posicin correcta mientras lo alimenta. Si la congestin persiste luego de 48 horas o despus de seguir estas recomendaciones, comunquese con su mdico o un Holiday representativeasesor en lactancia. RECOMENDACIONES GENERALES PARA EL CUIDADO DE LA SALUD DURANTE LA LACTANCIA MATERNA  Consuma alimentos saludables. Alterne comidas y colaciones, y coma 3 de cada una por da. Dado que lo que come  afecta la leche materna, es posible que algunas comidas hagan que su bebé se vuelva más irritable de lo habitual. Evite comer este tipo de alimentos si percibe que afectan de manera negativa al bebé. °· Beba leche, jugos de fruta y agua para satisfacer su sed (aproximadamente 10 vasos al día). °· Descanse con frecuencia, relájese y tome sus vitaminas  prenatales para evitar la fatiga, el estrés y la anemia. °· Continúe con los autocontroles de la mama. °· Evite masticar y fumar tabaco. °· Evite el consumo de alcohol y drogas. °Algunos medicamentos, que pueden ser perjudiciales para el bebé, pueden pasar a través de la leche materna. Es importante que consulte a su médico antes de tomar cualquier medicamento, incluidos todos los medicamentos recetados y de venta libre, así como los suplementos vitamínicos y herbales. °Puede quedar embarazada durante la lactancia. Si desea controlar la natalidad, consulte a su médico cuáles son las opciones más seguras para el bebé. °SOLICITE ATENCIÓN MÉDICA SI:  °· Usted siente que quiere dejar de amamantar o se siente frustrada con la lactancia. °· Siente dolor en las mamas o en los pezones. °· Sus pezones están agrietados o sangran. °· Sus pechos están irritados, sensibles o calientes. °· Tiene un área hinchada en cualquiera de las mamas. °· Siente escalofríos o fiebre. °· Tiene náuseas o vómitos. °· Presenta una secreción de otro líquido distinto de la leche materna de los pezones. °· Sus mamas no se llenan antes de amamantar al bebé para el quinto día después del parto. °· Se siente triste y deprimida. °· El bebé está demasiado somnoliento como para comer bien. °· El bebé tiene problemas para dormir. °· Moja menos de 3 pañales en 24 horas. °· Defeca menos de 3 veces en 24 horas. °· La piel del bebé o la parte blanca de los ojos se vuelven amarillentas. °· El bebé no ha aumentado de peso a los 5 días de vida. °SOLICITE ATENCIÓN MÉDICA DE INMEDIATO SI:  °· El bebé está muy cansado (letargo) y no se quiere despertar para comer. °· Le sube la fiebre sin causa. °Document Released: 04/10/2005 Document Revised: 04/15/2013 °ExitCare® Patient Information ©2015 ExitCare, LLC. This information is not intended to replace advice given to you by your health care provider. Make sure you discuss any questions you have with your health care  provider. ° °

## 2014-08-03 ENCOUNTER — Encounter: Payer: Medicaid Other | Admitting: Obstetrics & Gynecology

## 2014-08-03 ENCOUNTER — Ambulatory Visit: Payer: Self-pay

## 2014-08-03 NOTE — Lactation Note (Signed)
This note was copied from the chart of Kristy Evans. Lactation Consultation Note  Patient Name: Kristy Evans [id number]'s Date: 08/03/2014 Reason for consult: Follow-up assessment;Breast surgery (Eda Royal , Spanish Interpreter present at consult )  This mom is a Spanish speaking mother , dad speaks AlbaniaEnglish and translates intermittently with mom and interpreter. Baby is at 2 % weight loss 6-15.1 oz , LS =9, Breast feeding Range 10-60 mins , also supplementing with formula via bottle. 5 wets, 2 stools , 11-7 Bottles x6 ( 20-30 ml )  Bili check 3.4 at 35 hours.  Per moms desire to breast feed and bottle. LC discussed with a hx of reduction , it is recommended to breast feed 1st . Offering both breast. And supplement afterwards and post pump after 5 - 6 feedings a day to challenge moms brain and to enhance stimulation , therefore enhancing supply. Also stressed due to breast reduction and supplementing , post pumping is indicated. Dad and mom plan to buy a DEBP , 2 names of reliable DEBP to enhance milk supply  Given to mom and dad. LC discussed prevention and tx of sore nipples and engorgement.  Per mom per interpreter having soreness with latching , LC recommended prior to latch - breast massage , hand express, pre-pump before every feeding to make the nipple/ areola  Complex more elastic for a deeper latch and then reverse pressure. Or 10 mins prior to latch apply breast shell to push swelling away from the nipple. LC suggested calling back LC office for LC O/P apt when her milk comes in .  Mother informed of post-discharge support and given phone number to the lactation department, including services for phone call assistance; out-patient appointments; and breastfeeding support group. List of other breastfeeding resources in the community given in the handout. Encouraged mother to call for problems or concerns related to breastfeeding.    Maternal Data     Feeding Feeding Type: Bottle Fed - Formula Nipple Type: Slow - flow  LATCH Score/Interventions                Intervention(s): Breastfeeding basics reviewed (see LC note )     Lactation Tools Discussed/Used Tools: Shells;Pump Shell Type: Inverted Breast pump type: Manual WIC Program: No (per dad and mom ) Pump Review:  (hand pump had already been set up )   Consult Status Consult Status: Complete Date: 08/03/14    Kathrin Greathouseorio, Murel Shenberger Ann 08/03/2014, 12:01 PM

## 2014-08-04 NOTE — Progress Notes (Signed)
Post discharge chart review completed.  

## 2014-09-07 ENCOUNTER — Ambulatory Visit (INDEPENDENT_AMBULATORY_CARE_PROVIDER_SITE_OTHER): Payer: Medicaid Other | Admitting: Obstetrics & Gynecology

## 2014-09-07 ENCOUNTER — Encounter: Payer: Self-pay | Admitting: Obstetrics & Gynecology

## 2014-09-07 VITALS — BP 105/81 | HR 77 | Temp 98.0°F | Wt 127.9 lb

## 2014-09-07 DIAGNOSIS — N62 Hypertrophy of breast: Secondary | ICD-10-CM | POA: Diagnosis not present

## 2014-09-07 DIAGNOSIS — Z3201 Encounter for pregnancy test, result positive: Secondary | ICD-10-CM | POA: Diagnosis not present

## 2014-09-07 DIAGNOSIS — N6099 Unspecified benign mammary dysplasia of unspecified breast: Secondary | ICD-10-CM

## 2014-09-07 LAB — POCT PREGNANCY, URINE: Preg Test, Ur: POSITIVE — AB

## 2014-09-07 NOTE — Progress Notes (Signed)
States has had intercourse since delivery, sometimes without condoms, sometimes with condoms. Last intercourse without condom one week ago. Used interpreter Albertina SenegalMarly Adams. Explained to patient we can not start birth control today- she will need to either abstain from intercourse or use condoms each time. Then come back in 1-2  Weeks and get another pregnancy test- if negative may start birth control .  Today had a faint positive pregnancy test.

## 2014-09-07 NOTE — Progress Notes (Signed)
Patient ID: Kristy Evans, female   DOB: May 17, 1985, 29 y.o.   MRN: [id number] Subjective: has breast Dx from previous surgery     Kristy Evans is a 29 y.o. female who presents for a postpartum visit. She is 5weeks 2 days postpartum following a  Vaginal delivery:. I have fully reviewed the prenatal and intrapartum course. The delivery was at 40 wk1d gestational weeks. Outcome: spontaneous vaginal delivery. Anesthesia: epidural . Postpartum course has been good. Baby's course has been good. Baby is feeding by breast /bottle. Bleeding no bleeding. Bowel function is normal. Bladder function is normal. Patient is sexually active. Contraception method is desires depo-provera . Postpartum depression screening: negative.  The following portions of the patient's history were reviewed and updated as appropriate: allergies, current medications, past family history, past medical history, past social history, past surgical history and problem list.  Review of Systems Pertinent items are noted in HPI.  Has not uses BCM since delivery Objective:    There were no vitals taken for this visit.  General:  alert, cooperative and no distress   Breasts:  deferred  Lungs:    Heart:     Abdomen: soft, non-tender; bowel sounds normal; no masses,  no organomegaly   Vulva:  not evaluated  Vagina: not evaluated  Cervix:     Corpus: not examined  Adnexa:  not evaluated  Rectal Exam: Not performed.        Assessment:     normal  postpartum exam. Pap smear not done at today's visit.   Plan:    1. Contraception: Depo-Provera injections after 1-2 week neg test and abstinence 2. Breast center referral for h/o atypical breast tissue at time of surgery 2014 in DR 3. Follow up in as needed.    Adam PhenixJames G Arnold, MD 09/07/2014

## 2014-09-07 NOTE — Progress Notes (Signed)
Made referral appointment to [facility] 09/14/14 3pm for consult/ ultrasound.

## 2014-09-09 ENCOUNTER — Ambulatory Visit: Payer: Medicaid Other | Admitting: Obstetrics & Gynecology

## 2014-09-14 ENCOUNTER — Other Ambulatory Visit: Payer: Medicaid Other

## 2014-09-14 ENCOUNTER — Ambulatory Visit (INDEPENDENT_AMBULATORY_CARE_PROVIDER_SITE_OTHER): Payer: Medicaid Other | Admitting: General Practice

## 2014-09-14 VITALS — BP 113/72 | HR 72 | Temp 98.1°F | Ht 59.0 in | Wt 127.1 lb

## 2014-09-14 DIAGNOSIS — Z3042 Encounter for surveillance of injectable contraceptive: Secondary | ICD-10-CM | POA: Diagnosis not present

## 2014-09-14 LAB — POCT URINALYSIS DIP (DEVICE)
BILIRUBIN URINE: NEGATIVE
Glucose, UA: NEGATIVE mg/dL
Hgb urine dipstick: NEGATIVE
KETONES UR: NEGATIVE mg/dL
NITRITE: NEGATIVE
PROTEIN: NEGATIVE mg/dL
Specific Gravity, Urine: 1.02 (ref 1.005–1.030)
Urobilinogen, UA: 0.2 mg/dL (ref 0.0–1.0)
pH: 5 (ref 5.0–8.0)

## 2014-09-14 LAB — POCT PREGNANCY, URINE: Preg Test, Ur: NEGATIVE

## 2014-09-14 MED ORDER — MEDROXYPROGESTERONE ACETATE 104 MG/0.65ML ~~LOC~~ SUSP
104.0000 mg | Freq: Once | SUBCUTANEOUS | Status: AC
  Administered 2014-09-14: 104 mg via SUBCUTANEOUS

## 2014-09-15 ENCOUNTER — Ambulatory Visit
Admission: RE | Admit: 2014-09-15 | Discharge: 2014-09-15 | Disposition: A | Discharge status: Home / Self Care | Payer: Medicaid Other | Source: Ambulatory Visit | Attending: Obstetrics & Gynecology | Admitting: Obstetrics & Gynecology

## 2014-09-15 DIAGNOSIS — N6099 Unspecified benign mammary dysplasia of unspecified breast: Secondary | ICD-10-CM

## 2014-12-07 ENCOUNTER — Ambulatory Visit: Payer: Medicaid Other

## 2015-11-08 ENCOUNTER — Encounter: Payer: Self-pay | Admitting: Family Medicine

## 2015-11-08 LAB — POCT PREGNANCY, URINE: Preg Test, Ur: POSITIVE — AB

## 2015-11-25 ENCOUNTER — Encounter (HOSPITAL_COMMUNITY): Payer: Self-pay

## 2015-11-25 ENCOUNTER — Inpatient Hospital Stay (HOSPITAL_COMMUNITY)
Admission: AD | Admit: 2015-11-25 | Discharge: 2015-11-25 | Disposition: A | Discharge status: Home / Self Care | Payer: Managed Care, Other (non HMO) | Source: Ambulatory Visit | Attending: Obstetrics & Gynecology | Admitting: Obstetrics & Gynecology

## 2015-11-25 DIAGNOSIS — J45909 Unspecified asthma, uncomplicated: Secondary | ICD-10-CM | POA: Insufficient documentation

## 2015-11-25 DIAGNOSIS — Z3A10 10 weeks gestation of pregnancy: Secondary | ICD-10-CM | POA: Diagnosis not present

## 2015-11-25 DIAGNOSIS — O200 Threatened abortion: Secondary | ICD-10-CM | POA: Insufficient documentation

## 2015-11-25 DIAGNOSIS — O99511 Diseases of the respiratory system complicating pregnancy, first trimester: Secondary | ICD-10-CM | POA: Insufficient documentation

## 2015-11-25 DIAGNOSIS — O209 Hemorrhage in early pregnancy, unspecified: Secondary | ICD-10-CM | POA: Diagnosis present

## 2015-11-25 LAB — URINALYSIS, ROUTINE W REFLEX MICROSCOPIC
BILIRUBIN URINE: NEGATIVE
Glucose, UA: NEGATIVE mg/dL
KETONES UR: NEGATIVE mg/dL
Leukocytes, UA: NEGATIVE
NITRITE: NEGATIVE
PROTEIN: NEGATIVE mg/dL
SPECIFIC GRAVITY, URINE: 1.01 (ref 1.005–1.030)
pH: 6 (ref 5.0–8.0)

## 2015-11-25 LAB — URINE MICROSCOPIC-ADD ON
BACTERIA UA: NONE SEEN
WBC UA: NONE SEEN WBC/hpf (ref 0–5)

## 2015-11-25 LAB — WET PREP, GENITAL
Clue Cells Wet Prep HPF POC: NONE SEEN
SPERM: NONE SEEN
TRICH WET PREP: NONE SEEN
YEAST WET PREP: NONE SEEN

## 2015-11-25 LAB — CBC
HEMATOCRIT: 38.1 % (ref 36.0–46.0)
HEMOGLOBIN: 13.1 g/dL (ref 12.0–15.0)
MCH: 28.2 pg (ref 26.0–34.0)
MCHC: 34.4 g/dL (ref 30.0–36.0)
MCV: 82.1 fL (ref 78.0–100.0)
Platelets: 281 10*3/uL (ref 150–400)
RBC: 4.64 MIL/uL (ref 3.87–5.11)
RDW: 13.2 % (ref 11.5–15.5)
WBC: 14.8 10*3/uL — ABNORMAL HIGH (ref 4.0–10.5)

## 2015-11-25 LAB — TYPE AND SCREEN
ABO/RH(D): O POS
Antibody Screen: NEGATIVE

## 2015-11-25 NOTE — Discharge Instructions (Signed)
Amenaza de aborto °(Threatened Miscarriage) °La amenaza de aborto se produce cuando hay hemorragia vaginal durante las primeras 20 semanas de embarazo, pero el embarazo no se interrumpe. Si durante este período usted tiene hemorragia vaginal, el médico le hará pruebas para asegurarse de que el embarazo continúe. Si las pruebas muestran que usted continúa embarazada y que el "bebé" en desarrollo (feto) dentro del útero sigue creciendo, se considera que tuvo una amenaza de aborto. °La amenaza de aborto no implica que el embarazo vaya a terminar, pero sí aumenta el riesgo de perder el embarazo (aborto completo). °CAUSAS  °Por lo general, no se conoce la causa de la amenaza de aborto. Si el resultado final es el aborto completo, la causa más frecuente es la cantidad anormal de cromosomas del feto. Los cromosomas son las estructuras internas de las células que contienen todo el material genético. °Algunas de las causas de hemorragia vaginal que no ocasionan un aborto incluyen: °· Las relaciones sexuales. °· Las infecciones. °· Los cambios hormonales normales durante el embarazo. °· La hemorragia que se produce cuando el óvulo se implanta en el útero. °FACTORES DE RIESGO °Los factores de riesgo de hemorragia al principio del embarazo incluyen: °· Obesidad. °· Fumar. °· El consumo de cantidades excesivas de alcohol o cafeína. °· El consumo de drogas. °SIGNOS Y SÍNTOMAS °· Hemorragia vaginal leve. °· Dolor o cólicos abdominales leves. °DIAGNÓSTICO  °Si tiene hemorragia con o sin dolor abdominal antes de las 20 semanas de embarazo, el médico le hará pruebas para determinar si el embarazo continúa. Una prueba importante incluye el uso de ondas sonoras y de una computadora (ecografía) para crear imágenes del interior del útero. Otras pruebas incluyen el examen interno de la vagina y el útero (examen pélvico), y el control de la frecuencia cardíaca del feto.  °Es posible que le diagnostiquen una amenaza de aborto en los  siguientes casos: °· La ecografía muestra que el embarazo continúa. °· La frecuencia cardíaca del feto es alta. °· El examen pélvico muestra que la apertura entre el útero y la vagina (cuello del útero) está cerrada. °· Su frecuencia cardíaca y su presión arterial están estables. °· Los análisis de sangre confirman que el embarazo continúa. °TRATAMIENTO  °No se ha demostrado que ningún tratamiento evite que una amenaza de aborto se convierta en un aborto completo. Sin embargo, los cuidados adecuados en el hogar son importantes.  °INSTRUCCIONES PARA EL CUIDADO EN EL HOGAR  °· Asegúrese de asistir a todas las citas de cuidados prenatales. Esto es muy importante. °· Descanse lo suficiente. °· No tenga relaciones sexuales ni use tampones si tiene hemorragia vaginal. °· No se haga duchas vaginales. °· No fume ni consuma drogas. °· No beba alcohol. °· Evite la cafeína. °SOLICITE ATENCIÓN MÉDICA SI: °· Tiene una ligera hemorragia o manchado vaginal durante el embarazo. °· Tiene dolor o cólicos en el abdomen. °· Tiene fiebre. °SOLICITE ATENCIÓN MÉDICA DE INMEDIATO SI: °· Tiene una hemorragia vaginal abundante. °· Elimina coágulos de sangre por la vagina. °· Siente dolor en la parte baja de la espalda o cólicos abdominales intensos. °· Tiene fiebre, escalofríos y dolor abdominal intenso. °ASEGÚRESE DE QUE: °· Comprende estas instrucciones. °· Controlará su afección. °· Recibirá ayuda de inmediato si no mejora o si empeora. °  °Esta información no tiene como fin reemplazar el consejo del médico. Asegúrese de hacerle al médico cualquier pregunta que tenga. °  °Document Released: 01/18/2005 Document Revised: 04/15/2013 °Elsevier Interactive Patient Education ©2016 Elsevier Inc. ° °

## 2015-11-25 NOTE — MAU Note (Signed)
Patient presents via EMS with vaginal bleeding that started to day at 1300. Patient states that yesterday she was having headaches and today at 1300 started to have abdominal cramping and called EMS. Patient passed something on EMS.

## 2015-11-25 NOTE — MAU Note (Signed)
Signature pad is not working. Patient unable to sign.

## 2015-11-25 NOTE — MAU Provider Note (Signed)
History     CSN: [id number]  Arrival date and time: 11/25/15 1521   First Provider Initiated Contact with Patient 11/25/15 1600      Chief Complaint  Patient presents with  . Vaginal Bleeding   29 y.o. [id number] at [redacted]w[redacted]d by LMP here today after experiencing heavy bleeding with clots at home since yesterday. No prenatal care yet. Patient is Spanish-speaking only, Spanish interpreter present for this encounter.    The history is provided by the patient. The history is limited by a language barrier.  Vaginal Bleeding  This is a new problem. The current episode started yesterday. The problem occurs constantly. The problem has been rapidly improving. Associated symptoms include abdominal pain. Pertinent negatives include no chills, fever, nausea, urinary symptoms or vomiting. Nothing aggravates the symptoms. She has tried nothing for the symptoms.   Obstetric History   G4   P2   T1   P1   A1   L2    SAB1   TAB0   Ectopic0   Multiple0   Live Births2     # Outcome Date GA Lbr Len/2nd Weight Sex Delivery Anes PTL Lv  4 Current           3 Term 08/01/14 [redacted]w[redacted]d 13:52 / 00:21 7 lb 1.2 oz (3.21 kg) M Vag-Spont EPI  LIV     Apgar1:  8                Apgar5: 9  2 Preterm 12/26/01 [redacted]w[redacted]d    Vag-Spont   LIV  1 TAB                Past Medical History:  Diagnosis Date  . Asthma     Past Surgical History:  Procedure Laterality Date  . ABDOMINOPLASTY    . BREAST REDUCTION SURGERY    . RIGHT OOPHORECTOMY      History reviewed. No pertinent family history.  Social History  Substance Use Topics  . Smoking status: Never Smoker  . Smokeless tobacco: Never Used  . Alcohol use No    Allergies: No Known Allergies  No prescriptions prior to admission.    Review of Systems  Constitutional: Negative for chills and fever.  Gastrointestinal: Positive for abdominal pain. Negative for nausea and vomiting.  Genitourinary: Positive for vaginal bleeding.   Physical Exam   Blood pressure  124/75, pulse 95, temperature 98.8 F (37.1 C), temperature source Oral, resp. rate 18, last menstrual period 09/15/2015, currently breastfeeding.  Physical Exam  Constitutional: She is oriented to person, place, and time. She appears well-developed and well-nourished.  HENT:  Head: Normocephalic and atraumatic.  Neck: Normal range of motion. Neck supple.  Cardiovascular: Normal rate and regular rhythm.   Respiratory: Effort normal and breath sounds normal.  GI: Soft. Bowel sounds are normal. There is tenderness. There is no rebound and no guarding.  Mild tenderness in lower abdomen  Genitourinary:  Genitourinary Comments: Scant blood in vault. Closed cervix  Musculoskeletal: Normal range of motion.  Neurological: She is alert and oriented to person, place, and time.  Skin: Skin is warm and dry.  Psychiatric: She has a normal mood and affect. Her behavior is normal.    MAU Course  Procedures  MDM  Bedside ultrasound:  Viable SIUP, with FHR 155. CRL measurement is about 13 weeks, unable to get good BPD measurement   Results for orders placed or performed during the hospital encounter of 11/25/15 (from the past 24 hour(s))  Urinalysis, Routine w  reflex microscopic (not at [facility])     Status: Abnormal   Collection Time: 11/25/15  3:45 PM  Result Value Ref Range   Color, Urine YELLOW YELLOW   APPearance CLEAR CLEAR   Specific Gravity, Urine 1.010 1.005 - 1.030   pH 6.0 5.0 - 8.0   Glucose, UA NEGATIVE NEGATIVE mg/dL   Hgb urine dipstick MODERATE (A) NEGATIVE   Bilirubin Urine NEGATIVE NEGATIVE   Ketones, ur NEGATIVE NEGATIVE mg/dL   Protein, ur NEGATIVE NEGATIVE mg/dL   Nitrite NEGATIVE NEGATIVE   Leukocytes, UA NEGATIVE NEGATIVE  Wet prep, genital     Status: Abnormal   Collection Time: 11/25/15  3:45 PM  Result Value Ref Range   Yeast Wet Prep HPF POC NONE SEEN NONE SEEN   Trich, Wet Prep NONE SEEN NONE SEEN   Clue Cells Wet Prep HPF POC NONE SEEN NONE SEEN   WBC, Wet  Prep HPF POC FEW (A) NONE SEEN   Sperm NONE SEEN   Urine microscopic-add on     Status: Abnormal   Collection Time: 11/25/15  3:45 PM  Result Value Ref Range   Squamous Epithelial / LPF 0-5 (A) NONE SEEN   WBC, UA NONE SEEN 0 - 5 WBC/hpf   RBC / HPF 0-5 0 - 5 RBC/hpf   Bacteria, UA NONE SEEN NONE SEEN  Type and screen [facility]     Status: None (Preliminary result)   Collection Time: 11/25/15  4:02 PM  Result Value Ref Range   ABO/RH(D) O POS    Antibody Screen PENDING    Sample Expiration 11/28/2015   CBC     Status: Abnormal   Collection Time: 11/25/15  4:02 PM  Result Value Ref Range   WBC 14.8 (H) 4.0 - 10.5 K/uL   RBC 4.64 3.87 - 5.11 MIL/uL   Hemoglobin 13.1 12.0 - 15.0 g/dL   HCT 51.7 00.1 - 74.9 %   MCV 82.1 78.0 - 100.0 fL   MCH 28.2 26.0 - 34.0 pg   MCHC 34.4 30.0 - 36.0 g/dL   RDW 44.9 67.5 - 91.6 %   Platelets 281 150 - 400 K/uL    Assessment and Plan  Threatened abortion in early pregnancy Patient reassured by bedside scan; formal ultrasound ordered for next week for follow up and dating Bleeding precautions reviewed. Patient encouraged to start prenatal care soon, list of providers given to her. She will continue prenatal vitamins.   Tereso Newcomer, MD 11/25/2015, 4:38 PM

## 2015-11-26 LAB — GC/CHLAMYDIA PROBE AMP (~~LOC~~) NOT AT ARMC
Chlamydia: NEGATIVE
Neisseria Gonorrhea: NEGATIVE

## 2015-12-07 ENCOUNTER — Ambulatory Visit (INDEPENDENT_AMBULATORY_CARE_PROVIDER_SITE_OTHER): Payer: Managed Care, Other (non HMO) | Admitting: Medical

## 2015-12-07 ENCOUNTER — Encounter (HOSPITAL_COMMUNITY): Payer: Self-pay

## 2015-12-07 ENCOUNTER — Other Ambulatory Visit: Payer: Self-pay | Admitting: Obstetrics & Gynecology

## 2015-12-07 ENCOUNTER — Encounter: Payer: Self-pay | Admitting: Medical

## 2015-12-07 ENCOUNTER — Ambulatory Visit (HOSPITAL_COMMUNITY)
Admission: RE | Admit: 2015-12-07 | Discharge: 2015-12-07 | Disposition: A | Discharge status: Home / Self Care | Payer: Managed Care, Other (non HMO) | Source: Ambulatory Visit | Attending: Obstetrics & Gynecology | Admitting: Obstetrics & Gynecology

## 2015-12-07 ENCOUNTER — Other Ambulatory Visit (HOSPITAL_COMMUNITY): Payer: Self-pay | Admitting: Obstetrics & Gynecology

## 2015-12-07 VITALS — BP 115/62 | HR 90 | Wt 150.1 lb

## 2015-12-07 DIAGNOSIS — Z331 Pregnant state, incidental: Secondary | ICD-10-CM | POA: Insufficient documentation

## 2015-12-07 DIAGNOSIS — Z36 Encounter for antenatal screening of mother: Secondary | ICD-10-CM | POA: Diagnosis not present

## 2015-12-07 DIAGNOSIS — Z3689 Encounter for other specified antenatal screening: Secondary | ICD-10-CM

## 2015-12-07 DIAGNOSIS — Z3A14 14 weeks gestation of pregnancy: Secondary | ICD-10-CM | POA: Insufficient documentation

## 2015-12-07 DIAGNOSIS — O09891 Supervision of other high risk pregnancies, first trimester: Secondary | ICD-10-CM

## 2015-12-07 DIAGNOSIS — O0990 Supervision of high risk pregnancy, unspecified, unspecified trimester: Secondary | ICD-10-CM | POA: Insufficient documentation

## 2015-12-07 DIAGNOSIS — O09211 Supervision of pregnancy with history of pre-term labor, first trimester: Secondary | ICD-10-CM

## 2015-12-07 DIAGNOSIS — O0991 Supervision of high risk pregnancy, unspecified, first trimester: Secondary | ICD-10-CM

## 2015-12-07 LAB — POCT URINALYSIS DIP (DEVICE)
Bilirubin Urine: NEGATIVE
Glucose, UA: NEGATIVE mg/dL
Ketones, ur: NEGATIVE mg/dL
LEUKOCYTES UA: NEGATIVE
NITRITE: NEGATIVE
PH: 5.5 (ref 5.0–8.0)
PROTEIN: NEGATIVE mg/dL
Specific Gravity, Urine: 1.025 (ref 1.005–1.030)
UROBILINOGEN UA: 0.2 mg/dL (ref 0.0–1.0)

## 2015-12-07 NOTE — Progress Notes (Signed)
Subjective:  Kristy SievertYairy Ogando De Campbell is a 30 y.o. [phone] at 8162w6d being seen today for initial prenatal care visit today.  She is currently monitored for the following issues for this high-risk pregnancy and has Hx of preeclampsia, prior pregnancy, currently pregnant; Breast atypical hyperplasia; and Supervision of high-risk pregnancy on her problem list. Patient had US this morning, results are not finalized yet. Based on bedside US reported from MAU last week patient is likely closer to [redacted] weeks GA today. Will await final US results and change EDC if needed.   Patient reports headache.   . Vag. Bleeding: Scant.  Movement: Present. Denies leaking of fluid.   The following portions of the patient's history were reviewed and updated as appropriate: allergies, current medications, past family history, past medical history, past social history, past surgical history and problem list. Problem list updated.  Objective:   Vitals:   12/07/15 1001  BP: 115/62  Pulse: 90  Weight: 150 lb 1.6 oz (68.1 kg)    Fetal Status: Fetal Heart Rate (bpm): 155   Movement: Present     General:  Alert, oriented and cooperative. Patient is in no acute distress.  Skin: Skin is warm and dry. No rash noted.   Cardiovascular: Normal heart rate noted  Respiratory: Normal respiratory effort, no problems with respiration noted  Abdomen: Soft, gravid, appropriate for gestational age. Pain/Pressure: Present     Pelvic:  Cervical exam deferred        Extremities: Normal range of motion.  Edema: None  Mental Status: Normal mood and affect. Normal behavior. Normal judgment and thought content.   Urinalysis: Urine Protein: Negative Urine Glucose: Negative  Assessment and Plan:  Pregnancy: [id number] at 2362w6d  1. Supervision of high risk pregnancy in first trimester - POCT urinalysis dip (device) - Prenatal Profile - Culture, OB Urine - GC/Chlamydia probe amp ([place] - Pain Mgmt, Profile 6 Conf w/o mM,  U - Hemoglobinopathy Evaluation  2. History of preterm delivery, currently pregnant in first trimester  3. Headache - Normotensive today, advised to try Tylenol PRN for headache first and appropriate dosages given, can also try Ibuprofen sparingly if Tylenol is not effective   Preterm/first trimester labor symptoms and general obstetric precautions including but not limited to vaginal bleeding, contractions, leaking of fluid and fetal movement were reviewed in detail with the patient. Please refer to After Visit Summary for other counseling recommendations.  Return in about 4 weeks (around 01/04/2016) for HROB.   Marny LowensteinJulie N Wenzel, PA-C

## 2015-12-07 NOTE — Patient Instructions (Addendum)
For headache:  Tylenol 1000 mg every 6 hours Or  Ibuprofen 600 mg twice a day If using daily please let us know at your next visit   Primer trimestre de Psychiatristembarazo (First Trimester of Pregnancy) El primer trimestre de Psychiatristembarazo se extiende desde la semana1 hasta el final de la semana12 (mes1 al mes3). Durante este tiempo, el beb comenzar a desarrollarse dentro suyo. Entre la semana6 y [place]8, se forman los ojos y [place] rostro, y los latidos del corazn pueden verse en la ecografa. Al final de las 12semanas, todos los rganos del beb estn formados. La atencin prenatal es toda la asistencia mdica que usted recibe antes del nacimiento del beb. Asegrese de recibir una buena atencin prenatal y de seguir todas las indicaciones del mdico. CUIDADOS EN EL HOGAR  Medicamentos:  Tome los medicamentos solamente como se lo haya indicado el mdico. Algunos medicamentos se pueden tomar durante el Psychiatristembarazo y otros no.  Tome las vitaminas prenatales como se lo haya indicado el mdico.  Tome el medicamento que la ayuda a Advertising copywriterdefecar (laxante [place]uave) segn sea necesario, si el mdico lo autoriza. Dieta  Ingiera alimentos saludables de Corozalmanera regular.  El Firefightermdico le indicar la cantidad de peso que [place] Woodpuede aumentar.  No coma carne cruda ni quesos sin cocinar.  Si tiene Programme researcher, broadcasting/film/videomalestar estomacal (nuseas) o vomita:  Ingiera 4 o 5comidas pequeas por Geophysical data processorda en lugar de 3abundantes.  Intente comer algunas galletitas saladas.  Beba lquidos Altria Groupentre las comidas, en lugar de Sports coachhacerlo durante estas.  Si tiene dificultad para defecar (estreimiento):  Consuma alimentos con alto contenido de Nevadafibra, como verduras y frutas frescos, y Radiation protection practitionercereales integrales.  Beba suficiente lquido para mantener el pis (orina) claro o de color amarillo plido. Actividad y ejercicios  Haga ejercicios solamente como se lo haya indicado el mdico. Deje de hacer ejercicios si tiene clicos o dolor en la parte baja del vientre (abdomen) o  en la cintura.  Intente no estar de pie FedExdurante mucho tiempo. Mueva las piernas con frecuencia si debe estar de pie en un lugar durante mucho tiempo.  Evite levantar pesos Fortune Brandsexcesivos.  Use zapatos con tacones bajos. Mantenga una buena postura al sentarse y pararse.  Puede tener The St. Paul Travelersrelaciones sexuales, a menos que el mdico le indique lo contrario. Alivio del dolor o las molestias  Use un sostn que le brinde buen soporte si le duelen las Josephmamas.  Dese baos con agua tibia (baos de asiento) para Engineer, materialsaliviar el dolor o las molestias a causa de las hemorroides. Use crema antihemorroidal si el mdico se lo permite.  Descanse con las piernas elevadas si tiene calambres o dolor de cintura.  Use medias de descanso si tiene las venas de las piernas hinchadas y abultadas (venas varicosas). Eleve los pies durante 15minutos, 3 o 4veces por da. Limite la cantidad de sal en su dieta. Cuidados prenatales  Programe las visitas prenatales para la semana12 de [place]embarazo.  Escriba sus preguntas. Llvelas cuando concurra a las visitas prenatales.  Concurra a todas las visitas prenatales como se lo haya indicado el mdico. Seguridad  Colquese el cinturn de seguridad cuando conduzca.  Haga una lista con los nmeros de telfono en caso de Associate Professoremergencia, en la cual deben incluirse los nmeros de los familiares, los amigos, el hospital y los departamentos de polica y de bomberos. Consejos generales  Pdale al mdico que la derive a clases prenatales en su localidad. Debe comenzar a tomar las clases antes de Cytogeneticistentrar en el mes6 de embarazo.  Pida  ayuda si necesita asesoramiento o asistencia con la alimentacin. El mdico puede aconsejarla o indicarle dnde recurrir para recibir [place] and the Grenadinesayuda.  No se d baos de inmersin en agua caliente, baos turcos ni saunas.  No se haga duchas vaginales ni use tampones o toallas higinicas perfumadas.  No mantenga las piernas cruzadas durante [place] Bethanymucho tiempo.  Evite el contacto  con las bandejas sanitarias de los gatos y la tierra que estos animales usan.  No fume, no consuma hierbas ni beba alcohol. No tome frmacos que el mdico no haya autorizado.  No consuma ningn producto que contenga tabaco, lo que incluye cigarrillos, tabaco de Theatre managermascar o Administrator, Civil Servicecigarrillos electrnicos. Si necesita ayuda para dejar de fumar, consulte al American Expressmdico. Puede recibir asesoramiento u otro tipo de apoyo para dejar de fumar.  Visite al dentista. En su casa, lvese los dientes con un cepillo dental suave. Psese el hilo dental con suavidad. SOLICITE AYUDA SI:  Tiene mareos.  Tiene clicos leves o siente presin en la parte baja del vientre.  Siente un dolor persistente en la zona del vientre.  Sigue teniendo AT&Tmalestar estomacal, vomita o las heces son lquidas (diarrea).  Observa una secrecin, con mal olor que proviene de la vagina.  Siente dolor al ConocoPhillipsorinar.  Tiene el rostro, las Whitneymanos, las piernas o los tobillos ms hinchados (inflamados). SOLICITE AYUDA DE INMEDIATO SI:   Tiene fiebre.  Tiene una prdida de lquido por la vagina.  Tiene sangrado o pequeas prdidas vaginales.  Tiene clicos o dolor muy intensos en el vientre.  Sube o baja de peso rpidamente.  Vomita sangre. Puede ser similar a la borra del caf  Est en contacto con personas que tienen rubola, la quinta enfermedad o varicela.  Siente un dolor de cabeza muy intenso.  Le falta el aire.  Sufre cualquier tipo de traumatismo, por ejemplo, debido a una cada o un accidente automovilstico.   Esta informacin no tiene Theme park managercomo fin reemplazar el consejo del mdico. Asegrese de hacerle al mdico cualquier pregunta que tenga.   Document Released: 07/07/2008 Document Revised: 05/01/2014 Elsevier Interactive Patient Education [employer]2016 Elsevier Inc.

## 2015-12-08 ENCOUNTER — Encounter: Payer: Self-pay | Admitting: Obstetrics & Gynecology

## 2015-12-08 DIAGNOSIS — O468X2 Other antepartum hemorrhage, second trimester: Secondary | ICD-10-CM

## 2015-12-08 DIAGNOSIS — O418X20 Other specified disorders of amniotic fluid and membranes, second trimester, not applicable or unspecified: Secondary | ICD-10-CM | POA: Insufficient documentation

## 2015-12-08 LAB — PRENATAL PROFILE (SOLSTAS)
ANTIBODY SCREEN: NEGATIVE
BASOS PCT: 0 %
Basophils Absolute: 0 cells/uL (ref 0–200)
Eosinophils Absolute: 128 cells/uL (ref 15–500)
Eosinophils Relative: 1 %
HEMATOCRIT: 36.6 % (ref 35.0–45.0)
HIV: NONREACTIVE
Hemoglobin: 12.3 g/dL (ref 11.7–15.5)
Hepatitis B Surface Ag: NEGATIVE
LYMPHS PCT: 17 %
Lymphs Abs: 2176 cells/uL (ref 850–3900)
MCH: 28.3 pg (ref 27.0–33.0)
MCHC: 33.6 g/dL (ref 32.0–36.0)
MCV: 84.1 fL (ref 80.0–100.0)
MONO ABS: 640 {cells}/uL (ref 200–950)
MPV: 9.1 fL (ref 7.5–12.5)
Monocytes Relative: 5 %
Neutro Abs: 9856 cells/uL — ABNORMAL HIGH (ref 1500–7800)
Neutrophils Relative %: 77 %
PLATELETS: 302 10*3/uL (ref 140–400)
RBC: 4.35 MIL/uL (ref 3.80–5.10)
RDW: 13.8 % (ref 11.0–15.0)
RH TYPE: POSITIVE
Rubella: 19 Index — ABNORMAL HIGH (ref <0.90)
WBC: 12.8 10*3/uL — AB (ref 3.8–10.8)

## 2015-12-08 LAB — PAIN MGMT, PROFILE 6 CONF W/O MM, U
6 Acetylmorphine: NEGATIVE ng/mL (ref <10)
AMPHETAMINES: NEGATIVE ng/mL (ref <500)
Alcohol Metabolites: NEGATIVE ng/mL (ref <500)
Barbiturates: NEGATIVE ng/mL (ref <300)
Benzodiazepines: NEGATIVE ng/mL (ref <100)
CREATININE: 185.8 mg/dL (ref >=20.0)
Cocaine Metabolite: NEGATIVE ng/mL (ref <150)
MARIJUANA METABOLITE: NEGATIVE ng/mL (ref <20)
Methadone Metabolite: NEGATIVE ng/mL (ref <100)
OPIATES: NEGATIVE ng/mL (ref <100)
OXIDANT: NEGATIVE ug/mL (ref <200)
Oxycodone: NEGATIVE ng/mL (ref <100)
PLEASE NOTE: 0
Phencyclidine: NEGATIVE ng/mL (ref <25)
pH: 6.04 (ref 4.5–9.0)

## 2015-12-08 LAB — GC/CHLAMYDIA PROBE AMP (~~LOC~~) NOT AT ARMC
CHLAMYDIA, DNA PROBE: NEGATIVE
NEISSERIA GONORRHEA: NEGATIVE

## 2015-12-09 LAB — CULTURE, OB URINE: Organism ID, Bacteria: 10000

## 2015-12-09 LAB — HEMOGLOBINOPATHY EVALUATION
HEMATOCRIT: 36.6 % (ref 35.0–45.0)
HEMOGLOBIN: 12.3 g/dL (ref 11.7–15.5)
HGB A2 QUANT: 2.1 % (ref 1.8–3.5)
HGB A: 96.9 % (ref >96.0)
MCH: 28.3 pg (ref 27.0–33.0)
MCV: 84.1 fL (ref 80.0–100.0)
RBC: 4.35 MIL/uL (ref 3.80–5.10)
RDW: 13.8 % (ref 11.0–15.0)

## 2015-12-13 ENCOUNTER — Encounter: Payer: Self-pay | Admitting: Obstetrics & Gynecology

## 2016-01-03 ENCOUNTER — Encounter: Payer: Self-pay | Admitting: Obstetrics & Gynecology

## 2016-01-03 ENCOUNTER — Ambulatory Visit (INDEPENDENT_AMBULATORY_CARE_PROVIDER_SITE_OTHER): Payer: Managed Care, Other (non HMO) | Admitting: Obstetrics & Gynecology

## 2016-01-03 ENCOUNTER — Encounter: Payer: Self-pay | Admitting: Family Medicine

## 2016-01-03 VITALS — BP 125/76 | HR 95 | Wt 148.5 lb

## 2016-01-03 DIAGNOSIS — Z1379 Encounter for other screening for genetic and chromosomal anomalies: Secondary | ICD-10-CM

## 2016-01-03 DIAGNOSIS — IMO0002 Reserved for concepts with insufficient information to code with codable children: Secondary | ICD-10-CM

## 2016-01-03 DIAGNOSIS — O09292 Supervision of pregnancy with other poor reproductive or obstetric history, second trimester: Secondary | ICD-10-CM

## 2016-01-03 DIAGNOSIS — O0993 Supervision of high risk pregnancy, unspecified, third trimester: Secondary | ICD-10-CM

## 2016-01-03 DIAGNOSIS — O468X2 Other antepartum hemorrhage, second trimester: Secondary | ICD-10-CM

## 2016-01-03 DIAGNOSIS — Z0489 Encounter for examination and observation for other specified reasons: Secondary | ICD-10-CM

## 2016-01-03 DIAGNOSIS — O418X20 Other specified disorders of amniotic fluid and membranes, second trimester, not applicable or unspecified: Secondary | ICD-10-CM

## 2016-01-03 LAB — POCT URINALYSIS DIP (DEVICE)
BILIRUBIN URINE: NEGATIVE
Glucose, UA: NEGATIVE mg/dL
Ketones, ur: 15 mg/dL — AB
LEUKOCYTES UA: NEGATIVE
Nitrite: NEGATIVE
PH: 5.5 (ref 5.0–8.0)
Protein, ur: 30 mg/dL — AB
SPECIFIC GRAVITY, URINE: 1.025 (ref 1.005–1.030)
Urobilinogen, UA: 1 mg/dL (ref 0.0–1.0)

## 2016-01-03 NOTE — Progress Notes (Signed)
UA results- mod blood

## 2016-01-03 NOTE — Progress Notes (Signed)
Spanish Interpreter Blance Zada GirtLander  Pt reports that she had starting having bleeding and having clots after her appt on 12/07/15.  Pt reports bleeding for 15 days.  QUAD Screen today  Declined flu vaccine  F/u OB US scheduled for 01/11/16 @ 1045.  Pt notifed.

## 2016-01-04 LAB — AFP, QUAD SCREEN
AFP: 50.3 ng/mL
Age Alone: 1:640 {titer}
CURR GEST AGE: 18.6 wk
Down Syndrome Scr Risk Est: 1:779 {titer}
HCG, Total: 29.78 IU/mL
INH: 263.8 pg/mL
INTERPRETATION-AFP: NEGATIVE
MOM FOR HCG: 1.37
MoM for AFP: 1.11
MoM for INH: 1.91
OPEN SPINA BIFIDA: NEGATIVE
Osb Risk: 1:9310 {titer}
TRI 18 SCR RISK EST: NEGATIVE
uE3 Mom: 0.74
uE3 Value: 1.32 ng/mL

## 2016-01-04 NOTE — Progress Notes (Signed)
   PRENATAL VISIT NOTE  Subjective:  Donell SievertYairy Ogando De Campbell is a 30 y.o. [phone] at 517w5d being seen today for ongoing prenatal care.  She is currently monitored for the following issues for this high-risk pregnancy and has Hx of preeclampsia, prior pregnancy, currently pregnant; Breast atypical hyperplasia; Supervision of high-risk pregnancy; and Subchorionic hemorrhage in second trimester on her problem list.  Patient reports no bleeding for 10 days. She only takes the aspirin when she feels swollen.  Contractions: Not present. Vag. Bleeding: None.  Movement: Present. Denies leaking of fluid.   The following portions of the patient's history were reviewed and updated as appropriate: allergies, current medications, past family history, past medical history, past social history, past surgical history and problem list. Problem list updated.  Objective:   Vitals:   01/03/16 1032  BP: 125/76  Pulse: 95  Weight: 148 lb 8 oz (67.4 kg)    Fetal Status: Fetal Heart Rate (bpm): 154   Movement: Present     General:  Alert, oriented and cooperative. Patient is in no acute distress.  Skin: Skin is warm and dry. No rash noted.   Cardiovascular: Normal heart rate noted  Respiratory: Normal respiratory effort, no problems with respiration noted  Abdomen: Soft, gravid, appropriate for gestational age. Pain/Pressure: Present     Pelvic:  Cervical exam deferred        Extremities: Normal range of motion.  Edema: None  Mental Status: Normal mood and affect. Normal behavior. Normal judgment and thought content.   Urinalysis: Urine Protein: 1+ Urine Glucose: Negative  Assessment and Plan:  Pregnancy: [id number] at 387w5d  1. Evaluate anatomy not seen on prior sonogram - US MFM OB FOLLOW UP; Future  2. Genetic testing - AFP, Quad Screen  3.  Hx of preeclampsia, prior pregnancy, currently pregnant, second trimester - unsure of patient's history of pre eclampsia.  [place]  She can't remember if she received magnesium sulfate or if the baby was small.  She has not started ASA yet.  4.  Bleeding in second trimester - moderate size subchorionic hemorrhage noted in early 2nd trimester.  She has had some brown bleeding with clots but no further red bleeding.  Pt goes for US in next 2 weeks.  Will reassess clot then.  Will NOT start patient on aspirin given the bleeding (reviewed with Dr. Sherrie Georgeecker).  Since she is >16 weeks, literature does not show that initiating aspirin at this point is beneficial and she does have increased risk of bleeding.  Preterm labor symptoms and general obstetric precautions including but not limited to vaginal bleeding, contractions, leaking of fluid and fetal movement were reviewed in detail with the patient. Please refer to After Visit Summary for other counseling recommendations.    RTC 4 weeks with US in 2 weeks Lesly DukesKelly H Shenelle Klas, MD

## 2016-01-11 ENCOUNTER — Ambulatory Visit (HOSPITAL_COMMUNITY)
Admission: RE | Admit: 2016-01-11 | Discharge: 2016-01-11 | Disposition: A | Discharge status: Home / Self Care | Payer: Managed Care, Other (non HMO) | Source: Ambulatory Visit | Attending: Obstetrics & Gynecology | Admitting: Obstetrics & Gynecology

## 2016-01-11 ENCOUNTER — Other Ambulatory Visit: Payer: Self-pay | Admitting: Obstetrics & Gynecology

## 2016-01-11 DIAGNOSIS — Z3A19 19 weeks gestation of pregnancy: Secondary | ICD-10-CM

## 2016-01-11 DIAGNOSIS — Z36 Encounter for antenatal screening of mother: Secondary | ICD-10-CM | POA: Diagnosis not present

## 2016-01-11 DIAGNOSIS — Z0489 Encounter for examination and observation for other specified reasons: Secondary | ICD-10-CM

## 2016-01-11 DIAGNOSIS — IMO0002 Reserved for concepts with insufficient information to code with codable children: Secondary | ICD-10-CM

## 2016-02-04 ENCOUNTER — Ambulatory Visit (INDEPENDENT_AMBULATORY_CARE_PROVIDER_SITE_OTHER): Payer: Managed Care, Other (non HMO) | Admitting: Obstetrics & Gynecology

## 2016-02-04 VITALS — BP 114/64 | HR 79 | Wt 148.8 lb

## 2016-02-04 DIAGNOSIS — O0992 Supervision of high risk pregnancy, unspecified, second trimester: Secondary | ICD-10-CM

## 2016-02-04 DIAGNOSIS — O09299 Supervision of pregnancy with other poor reproductive or obstetric history, unspecified trimester: Secondary | ICD-10-CM

## 2016-02-04 DIAGNOSIS — Z23 Encounter for immunization: Secondary | ICD-10-CM

## 2016-02-04 DIAGNOSIS — O09292 Supervision of pregnancy with other poor reproductive or obstetric history, second trimester: Secondary | ICD-10-CM

## 2016-02-04 NOTE — Progress Notes (Signed)
Video Interpreter [phone]

## 2016-02-04 NOTE — Progress Notes (Signed)
   PRENATAL VISIT NOTE  Subjective:  Kristy Evans is a 30 y.o. [phone] at 7132w1d being seen today for ongoing prenatal care.  She is currently monitored for the following issues for this high-risk pregnancy and has Hx of preeclampsia, prior pregnancy, currently pregnant; Breast atypical hyperplasia; Supervision of high-risk pregnancy; and Subchorionic hemorrhage in second trimester on her problem list.  Spanish interpreter used during encounter.   Patient reports no complaints.  Contractions: Not present. Vag. Bleeding: None.  Movement: Present. Denies leaking of fluid.   The following portions of the patient's history were reviewed and updated as appropriate: allergies, current medications, past family history, past medical history, past social history, past surgical history and problem list. Problem list updated.  Objective:   Vitals:   02/04/16 0837  BP: 114/64  Pulse: 79  Weight: 148 lb 12.8 oz (67.5 kg)    Fetal Status: Fetal Heart Rate (bpm): 155   Movement: Present     General:  Alert, oriented and cooperative. Patient is in no acute distress.  Skin: Skin is warm and dry. No rash noted.   Cardiovascular: Normal heart rate noted  Respiratory: Normal respiratory effort, no problems with respiration noted  Abdomen: Soft, gravid, appropriate for gestational age. Pain/Pressure: Present     Pelvic:  Cervical exam deferred        Extremities: Normal range of motion.  Edema: None  Mental Status: Normal mood and affect. Normal behavior. Normal judgment and thought content.    Assessment and Plan:  Pregnancy: [id number] at 5832w1d  1. Hx of preeclampsia, prior pregnancy, currently pregnant Stable BP.  2. Flu vaccine need - Flu Vaccine QUAD 36+ mos IM given  3. Supervision of high risk pregnancy in second trimester Preterm labor symptoms and general obstetric precautions including but not limited to vaginal bleeding, contractions, leaking of fluid and fetal movement were  reviewed in detail with the patient. Please refer to After Visit Summary for other counseling recommendations.  Return in about 4 weeks (around 03/03/2016) for 1 hr GTT, 3rd trimester labs, TDap, OB Visit.  Tereso NewcomerUgonna A Aneira Cavitt, MD

## 2016-02-04 NOTE — Progress Notes (Signed)
Flu vaccine given today. 

## 2016-02-04 NOTE — Patient Instructions (Addendum)
Regrese a la clinica cuando tenga su cita. Si tiene problemas o preguntas, llama a la clinica o vaya a la sala de emergencia al [facility]   Eleccin del mtodo anticonceptivo (Contraception Choices) La anticoncepcin (control de la natalidad) es el uso de cualquier mtodo o dispositivo para Location managerevitar el embarazo. A continuacin se indican algunos de esos mtodos. MTODOS HORMONALES   El Implante contraconceptivo consiste en un tubo plstico delgado que contiene la hormona progesterona. No contiene estrgenos. El mdico inserta el tubo en la parte interna del brazo. El tubo puede Geneticist, molecularpermanecer en el lugar durante 3 aos. Despus de los 3 aos debe retirarse. El implante impide que los ovarios liberen vulos (ovulacin), espesa el moco cervical, lo que evita que los espermatozoides ingresen al tero y hace ms delgada la membrana que cubre el interior del tero.  Inyecciones de progesterona sola: las Insurance underwriteradministra el mdico cada 3 meses para Location managerevitar el embarazo. La progesterona sinttica impide que los ovarios liberen vulos. Tambin hacen que el moco cervical se espese y modifique el tejido de recubrimiento interno del tero. Esto hace ms difcil que los espermatozoides sobrevivan en el tero.  Las pldoras anticonceptivas contienen estrgenos y Education officer, museumprogesterona. Su funcin es ALLTEL Corporationevitar que los ovarios liberen vulos (ovulacin). Las hormonas de los anticonceptivos orales hacen que el moco cervical se haga ms espeso, lo que evita que el esperma ingrese al tero. Las pldoras anticonceptivas son recetadas por el mdico.Tambin se utilizan para tratar los perodos menstruales abundantes.  Minipldora: este tipo de pldora anticonceptiva contiene slo hormona progesterona. Deben tomarse todos los [address] del mes y debe recetarlas el mdico.  El parche de control de natalidad: contiene hormonas similares a las que contienen las pldoras anticonceptivas. Deben cambiarse una vez por semana y se utilizan bajo  prescripcin mdica.  Anillo vaginal: contiene hormonas similares a las que contienen las pldoras anticonceptivas. Se deja colocado durante tres semanas, se lo retira durante 1 semana y luego se coloca uno nuevo. La paciente debe sentirse cmoda al insertar y retirar el anillo de la vagina.Es necesaria la prescripcin mdica.  Anticonceptivos de emergencia: son mtodos para evitar un embarazo despus de Neomia Dearuna relacin sexual sin proteccin. Esta pldora puede tomarse inmediatamente despus de Child psychotherapisttener relaciones sexuales o hasta 5 Marquettedas de haber tenido sexo sin proteccin. Es ms efectiva si se toma poco tiempo despus de la relacin sexual. Los anticonceptivos de emergencia estn disponibles sin prescripcin mdica. Consltelo con su farmacutico. No use los anticonceptivos de emergencia como nico mtodo anticonceptivo. MTODOS DE Lenis NoonBARRERA   Condn masculino: es una vaina delgada (ltex o goma) que se coloca cubriendo al pene durante el acto sexual. Deri Fuellinguede usarse con espermicida para aumentar la efectividad.  Condn femenino. Es una funda delicada y blanda que se adapta holgadamente a la vagina antes de las Clinical research associaterelaciones sexuales.  Diafragma: es una barrera de ltex redonda y suave que debe ser recomendado por un profesional. Se inserta en la vagina, junto con un gel espermicida. Debe insertarse antes de Management consultanttener relaciones sexuales. Debe dejar el diafragma colocado en la vagina durante 6 a 8 horas despus de la relacin sexual.  Capuchn cervical: es una barrera de ltex o taza plstica redonda y Bahamassuave que cubre el cuello del tero y debe ser colocada por un mdico. Puede dejarlo colocado en la vagina hasta 48 horas despus de las Clinical research associaterelaciones sexuales.  Esponja: es una pieza blanda y circular de espuma de poliuretano. Contiene un espermicida. Se inserta en la vagina despus de mojarla  y antes de las The St. Paul Travelersrelaciones sexuales.  Espermicidas: son sustancias qumicas que matan o bloquean al esperma y no lo dejan  ingresar al cuello del tero y al tero. Vienen en forma de cremas, geles, supositorios, espuma o comprimidos. No es necesario tener Emergency planning/management officerreceta mdica. Se insertan en la vagina con un aplicador antes de Management consultanttener relaciones sexuales. El proceso debe repetirse cada vez que tiene relaciones sexuales. ANTICONCEPTIVOS INTRAUTERINOS  Dispositivo intrauterino (DIU) es un dispositivo en forma de T que se coloca en el tero durante el perodo menstrual, para Location managerevitar el embarazo. Hay dos tipos:  DIU de cobre: este tipo de DIU est recubierto con un alambre de cobre y se inserta dentro del tero. El cobre hace que el tero y las trompas de Falopio produzcan un liquido que Federated Department Storesdestruye los espermatozoides. Puede permanecer colocado durante 10 aos.  DIU con hormona: este tipo de DIU contiene la hormona progestina (progesterona sinttica). La hormona espesa el moco cervical y evita que los espermatozoides ingresen al tero y tambin afina la membrana que cubre el tero para evitar la implantacin del vulo fertilizado. La hormona debilita o destruye los espermatozoides que ingresan al tero. Puede Geneticist, molecularpermanecer en el lugar durante 3-5 aos, segn el tipo de DIU que se Stilesutilice. MTODOS ANTICONCEPTIVOS PERMANENTES  Ligadura de trompas en la mujer: se realiza sellando, atando u obstruyendo quirrgicamente las trompas de Falopio lo que impide que el vulo descienda hacia el tero.  Esterilizacin histeroscpica: Implica la colocacin de un pequeo espiral o la insercin en cada trompa de Falopio. El mdico utiliza una tcnica llamada histeroscopa para Primary school teacherrealizar este procedimiento. El dispositivo produce la formacin de tejido Designer, television/film setcicatrizal. Esto da como resultado una obstruccin permanente de las trompas de Falopio, de modo que la esperma no pueda fertilizar el vulo. Demora alrededor de 3 meses despus del procedimiento hasta que el conducto se obstruye. Tendr que usar otro mtodo anticonceptivo durante al menos 3  meses.  Esterilizacin masculina: se realiza ligando los conductos por los que pasan los espermatozoides (vasectoma).Esto impide que el esperma ingrese a la vagina durante el acto sexual. Luego del procedimiento, el hombre puede eyacular lquido (semen). MTODOS DE PLANIFICACIN NATURAL  Planificacin familiar natural: consiste en no Management consultanttener relaciones sexuales o usar un mtodo de barrera (condn, Mapletondiafragma, capuchn cervical) en los IKON Office Solutionsdas que la mujer podra quedar [place] Helenaembarazada.  Mtodo de calendario: consiste en el seguimiento de la duracin de cada ciclo menstrual y la identificacin de los perodos frtiles.  Mtodo de ovulacin: Paramedicconsiste en evitar las relaciones sexuales durante la ovulacin.  Mtodo sintotrmico: Advertising copywriterconsiste en evitar las relaciones sexuales en la poca en la que se est ovulando, utilizando un termmetro y tendiendo en cuenta los sntomas de la ovulacin.  Mtodo postovulacin: Youth workerconsiste en planificar las relaciones sexuales para despus de haber ovulado. Independientemente del tipo o mtodo anticonceptivo que usted elija, es importante que use condones para protegerse contra las infecciones de transmisin sexual (ETS). Hable con su mdico con respecto a qu mtodo anticonceptivo es el ms apropiado para usted.   Esta informacin no tiene Theme park managercomo fin reemplazar el consejo del mdico. Asegrese de hacerle al mdico cualquier pregunta que tenga.   Document Released: 04/10/2005 Document Revised: 12/11/2012 Elsevier Interactive Patient Education 2016 Arvin[employer]Elsevier Inc.   AltoonaVacuna Tdap (contra la difteria, el ttanos y la tosferina): Lo que debe saber (Tdap Vaccine [Tetanus, Diphtheria, and Pertussis]: What You Need to Know) 1. Por qu vacunarse? El ttanos, la difteria y la tosferina son enfermedades muy graves. La vacuna Tdap  nos puede proteger de estas enfermedades. Adems, la vacuna Tdap que se aplica a las Market researcher a los bebs recin nacidos contra la tosferina. En  la actualidad, el [place] (trismo) es una enfermedad poco frecuente en los [place]. Provoca la contraccin y el endurecimiento dolorosos de los msculos, por lo general, de todo el cuerpo.  Puede causar el endurecimiento de los msculos de la cabeza y el cuello, de modo que impide abrir la boca, tragar y en algunos casos, Industrial/product designer. El ttanos causa la muerte de aproximadamente 1de cada 10personas que contraen la infeccin, incluso despus de que reciben la mejor atencin mdica. La DIFTERIA tambin es poco frecuente en los [place] [employer]. Puede causar la formacin de una membrana gruesa en la parte posterior de la garganta.  Esto tiene como consecuencia problemas respiratorios, insuficiencia cardaca, parlisis y [place]. La TOSFERINA (tos convulsa) provoca episodios de tos intensa que pueden dificultar la respiracin y provocar vmitos y trastornos del sueo.  Tambin puede causar prdida de peso, incontinencia y fractura de [place]. Dos de cada 100 adolescentes y 5 de cada 100 adultos con tosferina deben ser hospitalizados o tienen complicaciones, que podran incluir neumona y [place]. Estas enfermedades son provocadas por bacterias. La difteria y la tosferina se contagian de Neomia Dear persona a otra a travs de las secreciones de la tos o el estornudo. El ttanos ingresa al organismo a travs de cortes, rasguos o heridas. Antes de las vacunas, en los [place] se informaban 200000 casos de difteria, 200000 casos de tosferina y cientos de casos de ttanos cada ao. Desde el inicio de la vacunacin, los informes de casos de ttanos y difteria han disminuido alrededor del 99%, y de tosferina, alrededor del 80%. 2. Madilyn Fireman Tdap La vacuna Tdap protege a adolescentes y adultos contra el ttanos, la difteria y la tosferina. Una dosis de Tdap se administra a los 11 o 12 aos. Las [employer] no recibieron la vacuna Tdap a esa edad deben recibirla tan pronto como sea posible. Es muy  importante que los mdicos y todos aquellos que tengan contacto cercano con bebs menores de reciban la vacuna Tdap. Las mujeres deben recibir una dosis de Tdap en cada Psychiatrist, para proteger al recin nacido de la tosferina. Los nios tienen mayor riesgo de complicaciones graves y potencialmente mortales debido a la tosferina. Otra vacuna llamada Td protege contra el ttanos y la difteria, pero no contra la tosferina. Todos deben recibir una dosis de refuerzo de Td cada 10 aos. La Tdap puede aplicarse como uno de estos refuerzos si nunca antes recibi esta vacuna. Tambin se puede aplicar despus de un corte o quemadura grave para prevenir la infeccin por ttanos. El mdico o la persona que le aplique la vacuna puede darle ms informacin al [employer]. La Tdap puede administrarse de manera segura simultneamente con otras vacunas. 3. Algunas personas no deben recibir la Energy Transfer Partners persona que alguna vez tuvo una reaccin alrgica potencialmente mortal a Neomia Dear dosis previa de cualquier vacuna contra el ttanos, la difteria o la tosferina, O que tenga una alergia grave a cualquiera de los componentes de esta vacuna, no debe recibir la vacuna Tdap. Informe a la persona que le aplica la vacuna si tiene cualquier alergia grave.  Una persona que estuvo en estado de coma o sufri mltiples convulsiones en el trmino de los 7das despus de recibir una dosis de DTP o DTaP, o una dosis previa de Tdap, no debe recibir Research scientist (medical)  Tdap, salvo que se haya encontrado otra causa que no fuera la vacuna. An puede recibir la Td.  Consulte con su mdico si:  tiene convulsiones u otro problema del sistema nervioso,  tuvo hinchazn o dolor intenso despus de cualquier vacuna contra la difteria o el ttanos,  alguna vez ha sufrido el sndrome de [place],  no se siente Research scientist (life sciences) en que se ha programado la vacuna. 4. Riesgos Con cualquier medicamento, incluyendo las vacunas, existe la posibilidad  de que aparezcan efectos secundarios. Suelen ser leves y desaparecen por s solos. Tambin son posibles las reacciones graves, pero en raras ocasiones. La [employer] de las personas a las que se les aplica la vacuna Tdap no tienen ningn problema. Problemas leves despus de la vacuna Tdap (No interfirieron en otras actividades)  Dolor en el lugar donde se aplic la vacuna (alrededor de 3 de cada 4 adolescentes o 2 de cada 3 adultos).  Enrojecimiento o hinchazn en el lugar donde se aplic la vacuna (1 de cada 5 personas).  Fiebre leve de al menos 100,39F (38C) (hasta alrededor de 1 cada 25 adolescentes o 1 de cada 100 adultos).  Dolor de cabeza (alrededor de 3 o 4 de cada 10 personas).  Cansancio (alrededor de 1 de cada 3 o 4 personas).  Nuseas, vmitos, diarrea, dolor de estmago (hasta 1 de cada 4 adolescentes o 1 de cada 10 adultos).  Escalofros, dolores articulares (alrededor de 1de cada 10personas).  Dolores corporales (alrededor de 1de cada 3 o 4personas).  Erupcin cutnea, inflamacin de los ganglios (poco frecuente). Problemas moderados despus de recibir la vacuna Tdap (Interfirieron en otras actividades, pero no requirieron atencin mdica)  Art therapist donde se aplic la vacuna (hasta 1de cada 5 o 6).  Enrojecimiento o inflamacin en el lugar donde se aplic la vacuna (hasta alrededor de 1 de cada 16adolescentes o 1 de cada 12adultos).  Fiebre de ms de 102F (38,8C) (alrededor de 1 de cada 100 adolescentes o 1 de cada 250 adultos).  Dolor de cabeza (alrededor de 1de cada 7adolescentes o 1de cada 10adultos).  Nuseas, vmitos, diarrea, dolor de estmago (hasta 1 o 3 de cada 100 personas).  Hinchazn de todo el brazo en el que se aplic la vacuna (hasta alrededor de 1de cada 500personas). Problemas graves despus de la vacuna Tdap (Impidieron Education officer, environmental las actividades habituales; requirieron atencin mdica)  Inflamacin, dolor intenso,  sangrado y enrojecimiento en el brazo en que se aplic la vacuna (poco frecuente). Problemas que podran ocurrir despus de cualquier vacuna:  Las personas a veces se desmayan despus de un procedimiento mdico, incluida la vacunacin. Si permanece sentado o recostado durante 15 minutos puede ayudar a [employer] y las lesiones causadas por las cadas. Informe al mdico si se siente mareado, tiene cambios en la visin o zumbidos en los odos.  Algunas personas sienten un dolor intenso en el hombro y tienen dificultad para mover el brazo donde se coloc la vacuna. Esto sucede con muy poca frecuencia.  Cualquier medicamento puede causar una reaccin alrgica grave. Dichas reacciones son Lynnae Sandhoff poco frecuentes con una vacuna (se calcula que menos de 1en un milln de dosis) y se producen de unos minutos a unas horas despus de Arts development officer. Al igual que con cualquier [employer] Data, existe una probabilidad muy remota de que una vacuna cause una lesin grave o la [place]. Se controla permanentemente la seguridad de las vacunas. Para obtener ms informacin, visite: http://floyd.org/. 5. Qu pasa si hay un problema  grave? A qu signos debo estar atento?  Observe todo lo que le preocupe, como signos de una reaccin alrgica grave, fiebre muy alta o comportamiento fuera de lo normal.  Los signos de una reaccin alrgica grave pueden incluir ronchas, hinchazn de la cara y la garganta, dificultad para respirar, latidos cardacos acelerados, mareos y debilidad. Generalmente, estos comenzaran entre unos pocos minutos y algunas horas despus de la vacunacin. Qu debo hacer?  Si usted piensa que se trata de una reaccin alrgica grave o de otra emergencia que no puede esperar, llame al 911 o lleve a la persona al hospital ms cercano. Sino, llame a su mdico.  Despus, la reaccin debe informarse al [address] de Informacin sobre Efectos Adversos de las [place] (Vaccine Adverse Event  Reporting System, VAERS). Su mdico puede presentar este informe, o puede hacerlo usted mismo a travs del sitio web de VAERS, en www.vaers.LAgents.no, o llamando al [phone]. VAERS no brinda recomendaciones mdicas. 6. [employer] de Compensacin de Daos por [employer] [place] de Compensacin de Daos por Administrator, arts (National Vaccine Injury Compensation Program, VICP) es un programa federal que fue creado para Patent examiner a las personas que puedan haber sufrido daos al recibir ciertas vacunas. Aquellas personas que consideren que han sufrido un dao como consecuencia de una vacuna y [place] saber ms acerca del programa y de cmo presentar Roslynn Amble, pueden llamar al [phone] o visitar su sitio web en SpiritualWord.at. Hay un lmite de tiempo para presentar un reclamo de compensacin. 7. Cmo puedo obtener ms informacin?  Consulte a su mdico. Este puede darle el prospecto de la vacuna o recomendarle otras fuentes de informacin.  Comunquese con el servicio de salud de su localidad o [address].  Comunquese con los Centros para Air traffic controller y la Prevencin de Child psychotherapist for Disease Control and Prevention , CDC).  Llame al [phone] (1-800-CDC-INFO), o  visite el sitio web [employer] en PicCapture.uy. Declaracin de informacin sobre la vacuna contra la difteria, el ttanos y la tosferina (Tdap) de los CDC (24/02/15)   Esta informacin no tiene Theme park manager el consejo del mdico. Asegrese de hacerle al mdico cualquier pregunta que tenga.   Document Released: 03/27/2012 Document Revised: 05/01/2014 Elsevier Interactive Patient Education [employer]

## 2016-03-03 ENCOUNTER — Encounter: Payer: Managed Care, Other (non HMO) | Admitting: Obstetrics and Gynecology

## 2016-03-08 ENCOUNTER — Encounter: Payer: Managed Care, Other (non HMO) | Admitting: Advanced Practice Midwife

## 2016-03-24 ENCOUNTER — Ambulatory Visit (INDEPENDENT_AMBULATORY_CARE_PROVIDER_SITE_OTHER): Payer: Managed Care, Other (non HMO) | Admitting: Obstetrics & Gynecology

## 2016-03-24 VITALS — BP 119/70 | HR 130 | Wt 150.0 lb

## 2016-03-24 DIAGNOSIS — O09293 Supervision of pregnancy with other poor reproductive or obstetric history, third trimester: Secondary | ICD-10-CM

## 2016-03-24 DIAGNOSIS — Z23 Encounter for immunization: Secondary | ICD-10-CM

## 2016-03-24 DIAGNOSIS — O0993 Supervision of high risk pregnancy, unspecified, third trimester: Secondary | ICD-10-CM

## 2016-03-24 DIAGNOSIS — O09299 Supervision of pregnancy with other poor reproductive or obstetric history, unspecified trimester: Secondary | ICD-10-CM

## 2016-03-24 NOTE — Progress Notes (Signed)
1 hr gtt today @ 1104

## 2016-03-24 NOTE — Progress Notes (Signed)
Patient was called back from lobby twice and was not there. Labs cancelled and patient will have to return for 2 hr gtt & 28 week labs- Alanda AmassBeronica will call patient.

## 2016-03-24 NOTE — Progress Notes (Signed)
   PRENATAL VISIT NOTE  Subjective:  Kristy Evans is a 30 y.o. [phone] at 246w1d being seen today for ongoing prenatal care.  She is currently monitored for the following issues for this high-risk pregnancy and has Hx of preeclampsia, prior pregnancy, currently pregnant; Breast atypical hyperplasia; and Supervision of high-risk pregnancy on her problem list.  Patient is Spanish-speaking only, Spanish interpreter present for this encounter.  Patient reports no complaints.  Contractions: Not present. Vag. Bleeding: None.  Movement: Present. Denies leaking of fluid.   The following portions of the patient's history were reviewed and updated as appropriate: allergies, current medications, past family history, past medical history, past social history, past surgical history and problem list. Problem list updated.  Objective:   Vitals:   03/24/16 0959  BP: 119/70  Pulse: (!) 130  Weight: 150 lb (68 kg)    Fetal Status: Fetal Heart Rate (bpm): 141 Fundal Height: 30 cm Movement: Present     General:  Alert, oriented and cooperative. Patient is in no acute distress.  Skin: Skin is warm and dry. No rash noted.   Cardiovascular: Normal heart rate noted  Respiratory: Normal respiratory effort, no problems with respiration noted  Abdomen: Soft, gravid, appropriate for gestational age. Pain/Pressure: Present     Pelvic:  Cervical exam deferred        Extremities: Normal range of motion.  Edema: Trace  Mental Status: Normal mood and affect. Normal behavior. Normal judgment and thought content.   Assessment and Plan:  Pregnancy: [id number] at 1346w1d  1. Need for Tdap vaccination - Tdap vaccine greater than or equal to 7yo IM  2. Hx of preeclampsia, prior pregnancy, currently pregnant Stable BP, on ASA  3. Supervision of high risk pregnancy in third trimester Third trimester labs today. - Glucose Tolerance, 1 HR (50g) - RPR - HIV antibody - CBC  Preterm labor symptoms and general  obstetric precautions including but not limited to vaginal bleeding, contractions, leaking of fluid and fetal movement were reviewed in detail with the patient. Please refer to After Visit Summary for other counseling recommendations.  Return in about 2 weeks (around 04/07/2016) for OB Visit.   Tereso NewcomerUgonna A Rishika Mccollom, MD

## 2016-03-24 NOTE — Patient Instructions (Signed)
Return to clinic for any scheduled appointments or obstetric concerns, or go to [facility] for evaluation  

## 2016-03-24 NOTE — Addendum Note (Signed)
Addended by: Kathee DeltonHILLMAN, CARRIE L on: 03/24/2016 11:27 AM   Modules accepted: Orders

## 2016-03-27 ENCOUNTER — Other Ambulatory Visit: Payer: Managed Care, Other (non HMO)

## 2016-03-31 ENCOUNTER — Other Ambulatory Visit: Payer: Managed Care, Other (non HMO)

## 2016-03-31 DIAGNOSIS — O0993 Supervision of high risk pregnancy, unspecified, third trimester: Secondary | ICD-10-CM

## 2016-03-31 LAB — CBC
HCT: 34.5 % — ABNORMAL LOW (ref 35.0–45.0)
Hemoglobin: 11.4 g/dL — ABNORMAL LOW (ref 11.7–15.5)
MCH: 27.3 pg (ref 27.0–33.0)
MCHC: 33 g/dL (ref 32.0–36.0)
MCV: 82.5 fL (ref 80.0–100.0)
MPV: 9.2 fL (ref 7.5–12.5)
PLATELETS: 253 10*3/uL (ref 140–400)
RBC: 4.18 MIL/uL (ref 3.80–5.10)
RDW: 13.7 % (ref 11.0–15.0)
WBC: 9 10*3/uL (ref 3.8–10.8)

## 2016-04-01 LAB — 2HR GTT W 1 HR, CARPENTER, 75 G
GLUCOSE, 2 HR, GEST: 128 mg/dL (ref <153)
Glucose, 1 Hr, Gest: 158 mg/dL (ref <180)
Glucose, Fasting, Gest: 73 mg/dL (ref 65–91)

## 2016-04-01 LAB — HIV ANTIBODY (ROUTINE TESTING W REFLEX): HIV: NONREACTIVE

## 2016-04-01 LAB — RPR

## 2016-04-07 ENCOUNTER — Ambulatory Visit (INDEPENDENT_AMBULATORY_CARE_PROVIDER_SITE_OTHER): Payer: Managed Care, Other (non HMO) | Admitting: Obstetrics and Gynecology

## 2016-04-07 VITALS — BP 109/63 | HR 92 | Wt 148.5 lb

## 2016-04-07 DIAGNOSIS — O0993 Supervision of high risk pregnancy, unspecified, third trimester: Secondary | ICD-10-CM

## 2016-04-07 DIAGNOSIS — O09299 Supervision of pregnancy with other poor reproductive or obstetric history, unspecified trimester: Secondary | ICD-10-CM

## 2016-04-07 DIAGNOSIS — O09293 Supervision of pregnancy with other poor reproductive or obstetric history, third trimester: Secondary | ICD-10-CM

## 2016-04-07 NOTE — Progress Notes (Signed)
   PRENATAL VISIT NOTE  Subjective:  Kristy Evans is a 30 y.o. [phone] at 33110w1d being seen today for ongoing prenatal care.  She is currently monitored for the following issues for this high-risk pregnancy and has Hx of preeclampsia, prior pregnancy, currently pregnant; Breast atypical hyperplasia; and Supervision of high-risk pregnancy on her problem list.  Patient reports no complaints.   .  .  Movement: Present. Denies leaking of fluid.   The following portions of the patient's history were reviewed and updated as appropriate: allergies, current medications, past family history, past medical history, past social history, past surgical history and problem list. Problem list updated.  Objective:   Vitals:   04/07/16 0951  BP: 109/63  Pulse: 92  Weight: 148 lb 8 oz (67.4 kg)    Fetal Status: Fetal Heart Rate (bpm): 152 Fundal Height: 32 cm Movement: Present     General:  Alert, oriented and cooperative. Patient is in no acute distress.  Skin: Skin is warm and dry. No rash noted.   Cardiovascular: Normal heart rate noted  Respiratory: Normal respiratory effort, no problems with respiration noted  Abdomen: Soft, gravid, appropriate for gestational age. Pain/Pressure: Present     Pelvic:  Cervical exam deferred        Extremities: Normal range of motion.     Mental Status: Normal mood and affect. Normal behavior. Normal judgment and thought content.   Assessment and Plan:  Pregnancy: [id number] at 61110w1d  1. Supervision of high risk pregnancy in third trimester Patient is doing well without complaints Reviewed glucola results with the patient Patient is considering Nexplanon for contraception  2. Hx of preeclampsia, prior pregnancy, currently pregnant Normotensive Continue close monitoring  Preterm labor symptoms and general obstetric precautions including but not limited to vaginal bleeding, contractions, leaking of fluid and fetal movement were reviewed in detail with  the patient. Please refer to After Visit Summary for other counseling recommendations.  Return in about 2 weeks (around 04/21/2016).   Catalina AntiguaPeggy Nicollette Wilhelmi, MD

## 2016-04-21 ENCOUNTER — Ambulatory Visit (INDEPENDENT_AMBULATORY_CARE_PROVIDER_SITE_OTHER): Payer: Managed Care, Other (non HMO) | Admitting: Obstetrics & Gynecology

## 2016-04-21 VITALS — BP 107/64 | HR 97 | Wt 149.8 lb

## 2016-04-21 DIAGNOSIS — O0993 Supervision of high risk pregnancy, unspecified, third trimester: Secondary | ICD-10-CM

## 2016-04-21 DIAGNOSIS — O09299 Supervision of pregnancy with other poor reproductive or obstetric history, unspecified trimester: Secondary | ICD-10-CM

## 2016-04-21 NOTE — Progress Notes (Signed)
Spanish Interpreter Raquel Mora 

## 2016-04-21 NOTE — Progress Notes (Signed)
   PRENATAL VISIT NOTE  Subjective:  Kristy Evans is a 30 y.o. [phone] at 6825w1d being seen today for ongoing prenatal care.  She is currently monitored for the following issues for this low-risk pregnancy and has Hx of preeclampsia, prior pregnancy, currently pregnant; Breast atypical hyperplasia; and Supervision of high-risk pregnancy on her problem list.  Patient reports no complaints.   .  .   . Denies leaking of fluid.   The following portions of the patient's history were reviewed and updated as appropriate: allergies, current medications, past family history, past medical history, past social history, past surgical history and problem list. Problem list updated.  Objective:  There were no vitals filed for this visit.  Fetal Status:           General:  Alert, oriented and cooperative. Patient is in no acute distress.  Skin: Skin is warm and dry. No rash noted.   Cardiovascular: Normal heart rate noted  Respiratory: Normal respiratory effort, no problems with respiration noted  Abdomen: Soft, gravid, appropriate for gestational age.       Pelvic:  Cervical exam deferred        Extremities: Normal range of motion.     Mental Status: Normal mood and affect. Normal behavior. Normal judgment and thought content.   Assessment and Plan:  Pregnancy: [id number] at 3125w1d  1. Supervision of high risk pregnancy in third trimester -cervical cultures at next visit  2. Hx of preeclampsia, prior pregnancy, currently pregnant   Preterm labor symptoms and general obstetric precautions including but not limited to vaginal bleeding, contractions, leaking of fluid and fetal movement were reviewed in detail with the patient. Please refer to After Visit Summary for other counseling recommendations.  No Follow-up on file.   Allie BossierMyra C Eveline Sauve, MD

## 2016-04-24 NOTE — L&D Delivery Note (Signed)
Delivery Note At 11:34 AM a viable female was delivered via Vaginal, Spontaneous Delivery (Presentation:vertex ; ROA  ).  APGAR: 9, 9; weight  pending.   Placenta status: delivered intact with gentle traction, retained membranes removed with ring forcep Cord: 3 vessel  with the following complications: none.  Cord pH: not collected  Anesthesia:  epidural Episiotomy: None Lacerations:  none Est. Blood Loss (mL): 250  Mom to postpartum.  Baby to Couplet care / Skin to Skin.  Ernestina Pennaicholas Schenk 06/07/2016, 12:01 PM

## 2016-05-05 ENCOUNTER — Ambulatory Visit (INDEPENDENT_AMBULATORY_CARE_PROVIDER_SITE_OTHER): Payer: Medicaid Other | Admitting: Obstetrics & Gynecology

## 2016-05-05 ENCOUNTER — Encounter: Payer: Self-pay | Admitting: Obstetrics & Gynecology

## 2016-05-05 ENCOUNTER — Other Ambulatory Visit (HOSPITAL_COMMUNITY)
Admission: RE | Admit: 2016-05-05 | Discharge: 2016-05-05 | Disposition: A | Discharge status: Home / Self Care | Payer: Medicaid Other | Source: Ambulatory Visit | Attending: Obstetrics & Gynecology | Admitting: Obstetrics & Gynecology

## 2016-05-05 VITALS — BP 97/66 | HR 121 | Wt 148.6 lb

## 2016-05-05 DIAGNOSIS — Z113 Encounter for screening for infections with a predominantly sexual mode of transmission: Secondary | ICD-10-CM

## 2016-05-05 DIAGNOSIS — N62 Hypertrophy of breast: Secondary | ICD-10-CM

## 2016-05-05 DIAGNOSIS — N6099 Unspecified benign mammary dysplasia of unspecified breast: Secondary | ICD-10-CM

## 2016-05-05 DIAGNOSIS — O09299 Supervision of pregnancy with other poor reproductive or obstetric history, unspecified trimester: Secondary | ICD-10-CM

## 2016-05-05 DIAGNOSIS — O0993 Supervision of high risk pregnancy, unspecified, third trimester: Secondary | ICD-10-CM

## 2016-05-05 DIAGNOSIS — O09293 Supervision of pregnancy with other poor reproductive or obstetric history, third trimester: Secondary | ICD-10-CM

## 2016-05-05 LAB — OB RESULTS CONSOLE GBS: GBS: NEGATIVE

## 2016-05-05 LAB — OB RESULTS CONSOLE GC/CHLAMYDIA: Gonorrhea: NEGATIVE

## 2016-05-05 NOTE — Progress Notes (Signed)
Used [employer] Video interpreter Trinna PostAlex [phone].

## 2016-05-05 NOTE — Progress Notes (Signed)
   PRENATAL VISIT NOTE  Subjective:  Kristy SievertYairy Ogando De Evans is a 31 y.o. [phone] at 5528w1d being seen today for ongoing prenatal care.  She is currently monitored for the following issues for this high-risk pregnancy and has Hx of preeclampsia, prior pregnancy, currently pregnant; Breast atypical hyperplasia; and Supervision of high-risk pregnancy on her problem list.  Patient reports no complaints.  Contractions: Not present. Vag. Bleeding: None.  Movement: Present. Denies leaking of fluid.   The following portions of the patient's history were reviewed and updated as appropriate: allergies, current medications, past family history, past medical history, past social history, past surgical history and problem list. Problem list updated.  Objective:   Vitals:   05/05/16 0946  BP: 97/66  Pulse: (!) 121  Weight: 148 lb 9.6 oz (67.4 kg)    Fetal Status: Fetal Heart Rate (bpm): 129   Movement: Present     General:  Alert, oriented and cooperative. Patient is in no acute distress.  Skin: Skin is warm and dry. No rash noted.   Cardiovascular: Normal heart rate noted  Respiratory: Normal respiratory effort, no problems with respiration noted  Abdomen: Soft, gravid, appropriate for gestational age. Pain/Pressure: Present     Pelvic:  Cervical exam performed        Extremities: Normal range of motion.  Edema: Trace  Mental Status: Normal mood and affect. Normal behavior. Normal judgment and thought content.   Assessment and Plan:  Pregnancy: [id number] at 7728w1d  1. Hx of preeclampsia, prior pregnancy, currently pregnant Normal BP - Culture, beta strep (group b only) - [facility]lam[facility]   2. Breast atypical hyperplasia   3. Supervision of high risk pregnancy in third trimester  - Culture, beta strep (group b only) - GC[facility]Chlamydia probe amp [facility]  Preterm labor symptoms and general obstetric precautions including but not limited to  vaginal bleeding, contractions, leaking of fluid and fetal movement were reviewed in detail with the patient. Please refer to After Visit Summary for other counseling recommendations.  Return in about 1 week (around 05/12/2016).   Adam PhenixJames G Arnold, MD

## 2016-05-07 LAB — CULTURE, BETA STREP (GROUP B ONLY)

## 2016-05-08 LAB — GC/CHLAMYDIA PROBE AMP (~~LOC~~) NOT AT ARMC
CHLAMYDIA, DNA PROBE: NEGATIVE
Neisseria Gonorrhea: NEGATIVE

## 2016-05-15 ENCOUNTER — Ambulatory Visit (INDEPENDENT_AMBULATORY_CARE_PROVIDER_SITE_OTHER): Payer: Managed Care, Other (non HMO) | Admitting: Advanced Practice Midwife

## 2016-05-15 ENCOUNTER — Encounter: Payer: Self-pay | Admitting: Advanced Practice Midwife

## 2016-05-15 VITALS — BP 115/79 | HR 100 | Wt 150.6 lb

## 2016-05-15 DIAGNOSIS — O09293 Supervision of pregnancy with other poor reproductive or obstetric history, third trimester: Secondary | ICD-10-CM

## 2016-05-15 DIAGNOSIS — O09299 Supervision of pregnancy with other poor reproductive or obstetric history, unspecified trimester: Secondary | ICD-10-CM

## 2016-05-15 NOTE — Patient Instructions (Signed)
Parto vaginal (Vaginal Delivery) Durante el parto, el mdico la ayudar a dar a luz a su beb. En elparto vaginal, deber pujar para que el beb salga por la vagina. Sin embargo, antes de que pueda sacar al beb, es necesario que ocurran ciertas cosas. La abertura del tero (cuello del tero) tiene que ablandarse, hacerse ms delgado y abrirse (dilatar) hasta que llegue a 10 cm. Adems, el beb tiene que bajar desde el tero a la vagina. SIGNOS DE TRABAJO DE PARTO  El mdico tendr primero que asegurarse de que usted est en trabajo de parto. Algunos signos son:   Eliminar lo que se llama tapn mucoso antes del inicio del trabajo de parto. Este es una pequea cantidad de mucosidad teida con sangre.  Tener contracciones uterinas regulares y dolorosas.   El tiempo entre las contracciones debe acortarse  Las molestias y el dolor se harn ms intensos gradualmente.  El dolor de las contracciones empeora al caminar y no se alivia con el reposo.   El cuello del tero se hace mas delgado (se borra) y se dilata. ANTES DEL PARTO Una vez que se inicie el trabajo de parto y sea admitida en el hospital o sanatorio, el mdico podr hacer lo siguiente:   Realizar un examen fsico.  Controlar si hay complicaciones relacionadas con el trabajo de parto.  Verificar su presin arterial, temperatura y pulso y la frecuencia cardaca (signos vitales).   Determinar si se ha roto el saco amnitico y cundo ha ocurrido.  Realizar un examen vaginal (utilizando un guante estril y un lubricante) para determinar: ? La posicin (presentacin) del beb. El beb se presenta con la cabeza primero (vertex) en el canal de parto (vagina), o estn los pies o las nalgas primero (de nalgas)? ? El nivel (estacin) de la cabeza del beb dentro del canal de parto. ? El borramiento y la dilatacin del cuello uterino  El monitor fetal electrnico generalmente se coloca sobre el abdomen al llegar. Se utiliza para  controlar las contracciones y la frecuencia cardaca del beb. ? Cuando el monitor est en el abdomen (monitor fetal externo), slo toma la frecuencia y la duracin de las contracciones. No informa acerca de la intensidad de las contracciones. ? Si el mdico necesita saber exactamente la intensidad de las contracciones o cul es la frecuencia cardaca del beb, colocar un monitor interno en la vagina y el tero. El mdico comentar los riesgos y los beneficios de usar un monitor interno y le pedir autorizacin antes de colocar el dispositivo. ? El monitoreo fetal continuo ser necesario si le han aplicado una epidural, si le administran ciertos medicamentos (como oxitocina) y si tiene complicaciones del embarazo o del trabajo de parto.  Podrn colocarle una va intravenosa en una vena del brazo para suministrarle lquidos y medicamentos, si es necesario. TRES ETAPAS DEL TRABAJO DE PARTO Y EL PARTO El trabajo de parto y el parto normales se dividen en tres etapas. Primera etapa Esta etapa comienza cuando comienzan las contracciones regulares y el cuello comienza a borrarse y dilatarse. Finaliza cuando el cuello est completamente abierto (completamente dilatado). La primera etapa es la etapa ms larga del trabajo de parto y puede durar desde 3 horas a 15 horas.  Algunos mtodos estn disponibles para ayudar con el dolor del parto. Usted y su mdico decidirn qu opcin es la mejor para usted. Las opciones incluyen:   Medicamentos narcticos. Estos son medicamentos fuertes que usted puede recibir a travs de una va intravenosa o   como inyeccin en el msculo. Estos medicamentos alivian el dolor pero no hacen que desaparezca completamente.  Epidural. Se administra un medicamento a travs de un tubo delgado que se inserta en la espalda. El medicamento adormece la parte inferior del cuerpo y evita el dolor en esa zona.  Bloqueo paracervical Es una inyeccin de un anestsico en cada lado del cuello  uterino.  Usted podr pedir un parto natural, que implica que no se usen analgsicos ni epidural durante el parto y el trabajo de parto. En cambio, podr tener otro tipo de ayuda como ejercicios respiratorios para hacer frente al dolor. Segunda etapa La segunda etapa del trabajo de parto comienza cuando el cuello se ha dilatado completamente a 10 cm. Contina hasta que usted puja al beb hacia abajo, por el canal de parto, y el beb nace. Esta etapa puede durar slo algunos minutos o algunas horas.  La posicin del la cabeza del beb a medida que pasa por el canal de parto, es informada como un nmero, llamado estacin. Si la cabeza del beb no ha iniciado su descenso, la estacin se describe como que est en menos 3 (-3). Cuando la cabeza del beb est en la estacin cero, est en el medio del canal de parto y se encaja en la pelvis. La estacin en la que se encuentra el beb indica el progreso de la segunda etapa del trabajo de parto.  Cuando el beb nace, el mdico lo sostendr con la cabeza hacia abajo para evitar que el lquido amnitico, el moco y la sangre entren en los pulmones del beb. La boca y la nariz del beb podrn ser succionadas con un pequeo bulbo para retirar todo lquido adicional.  El mdico podr colocar al beb sobre su estmago. Es importante evitar que el beb tome fro. Para hacerlo, el mdico secar al beb, lo colocar directamente sobre su piel, (sin mantas entre usted y el beb) y lo cubrir con mantas secas y tibias.  Se corta el cordn umbilical. Tercera etapa Durante la tercera etapa del trabajo de parto, el mdico sacar la placenta (alumbramiento) y se asegurar de que el sangrado est controlado. La salida de la placenta generalmente demora 5 minutos pero puede tardar hasta 30 minutos. Luego de la salida de la placenta, le darn un medicamento por va intravenosa o inyectable para ayudar a contraer el tero y controlar el sangrado. Si planea amamantar al beb,  puede intentar en este momento Luego de la salida de la placenta, el tero debe contraerse y quedar muy firme. Si el tero no queda firme, el mdico lo masajear. Esto es importante debido a que la contraccin del tero ayuda a cortar el sangrado en el sitio en que la placenta estaba unida al tero. Si el tero no se contrae adecuadamente ni permanece firme, podr causar un sangrado abundante. Si hay mucho sangrado, podrn darle medicamentos para contraer el tero y detener el sangrado.  Esta informacin no tiene como fin reemplazar el consejo del mdico. Asegrese de hacerle al mdico cualquier pregunta que tenga. Document Released: 03/23/2008 Document Revised: 05/01/2014 Elsevier Interactive Patient Education  2017 Elsevier Inc.  

## 2016-05-15 NOTE — Progress Notes (Signed)
   PRENATAL VISIT NOTE  Subjective:  Kristy Evans is a 31 y.o. [id number] at 1928w4d being seen today for ongoing prenatal care.  She is currently monitored for the following issues for this low-risk pregnancy and has Hx of preeclampsia, prior pregnancy, currently pregnant; Breast atypical hyperplasia; and Supervision of high-risk pregnancy on her problem list.  Patient reports no complaints.  Contractions: Not present. Vag. Bleeding: None.  Movement: Present. Denies leaking of fluid.   The following portions of the patient's history were reviewed and updated as appropriate: allergies, current medications, past family history, past medical history, past social history, past surgical history and problem list. Problem list updated.  Objective:   Vitals:   05/15/16 1533  BP: 115/79  Pulse: 100  Weight: 150 lb 9.6 oz (68.3 kg)    Fetal Status: Fetal Heart Rate (bpm): 140   Movement: Present     General:  Alert, oriented and cooperative. Patient is in no acute distress.  Skin: Skin is warm and dry. No rash noted.   Cardiovascular: Normal heart rate noted  Respiratory: Normal respiratory effort, no problems with respiration noted  Abdomen: Soft, gravid, appropriate for gestational age. Pain/Pressure: Present     Pelvic:  Cervical exam deferred        Extremities: Normal range of motion.  Edema: Trace  Mental Status: Normal mood and affect. Normal behavior. Normal judgment and thought content.   Assessment and Plan:  Pregnancy: [id number] at 4528w4d  There are no diagnoses linked to this encounter. Term labor symptoms and general obstetric precautions including but not limited to vaginal bleeding, contractions, leaking of fluid and fetal movement were reviewed in detail with the patient. Please refer to After Visit Summary for other counseling recommendations.  Video interpretor used  RTC 1 week  Aviva SignsMarie L Victorious Cosio, CNM

## 2016-05-26 ENCOUNTER — Telehealth (HOSPITAL_COMMUNITY): Payer: Self-pay | Admitting: *Deleted

## 2016-05-26 ENCOUNTER — Ambulatory Visit (INDEPENDENT_AMBULATORY_CARE_PROVIDER_SITE_OTHER): Payer: Managed Care, Other (non HMO) | Admitting: Obstetrics & Gynecology

## 2016-05-26 VITALS — BP 99/74 | HR 127 | Wt 149.6 lb

## 2016-05-26 DIAGNOSIS — O09293 Supervision of pregnancy with other poor reproductive or obstetric history, third trimester: Secondary | ICD-10-CM

## 2016-05-26 DIAGNOSIS — O09299 Supervision of pregnancy with other poor reproductive or obstetric history, unspecified trimester: Secondary | ICD-10-CM

## 2016-05-26 DIAGNOSIS — O0993 Supervision of high risk pregnancy, unspecified, third trimester: Secondary | ICD-10-CM

## 2016-05-26 NOTE — Telephone Encounter (Signed)
[phone] interpreter number.Preadmission screen

## 2016-05-26 NOTE — Patient Instructions (Signed)
Regrese a la clinica cuando tenga su cita. Si tiene problemas o preguntas, llama a la clinica o vaya a la [facility] al [facility].   Induccin del trabajo de parto (Labor Induction) Se denomina induccin del trabajo de parto cuando se inician acciones para hacer que una mujer embarazada comience el trabajo de parto. La mayora de las mujeres comienzan el trabajo de parto sin ayuda entre las semanas 37 y 42 del embarazo. Cuando esto no ocurre o cuando hay una necesidad mdica, pueden utilizarse diferentes mtodos para inducirlo. La induccin del trabajo de parto hace que el tero se contraiga. Tambin hace que el cuello del tero se ablandemadure), se abra (se dilate), y se afine (se borre). Generalmente el trabajo de parto no se induce antes de las 39 semanas excepto que haya un problema con el beb o con la madre. Antes de inducir el trabajo de parto, el mdico considerar cierto nmero de factores incluyendo los siguientes:  El estado del beb.  Cuntas semanas tiene de embarazo.  La madurez de los pulmones del beb.  El estado del cuello del tero.  La posicin del beb. CULES SON LOS MOTIVOS PARA INDUCIR UN PARTO? El trabajo de parto puede inducirse por las siguientes razones:  La salud del beb o de la madre estn en riesgo.  El embarazo se ha pasado de trmino en 1 semana o ms.  Ha roto la bolsa de aguas pero no se ha iniciado el trabajo de parto por s mismo.  La madre tiene algn trastorno de salud o una enfermedad grave, como hipertensin arterial, una infeccin, desprendimiento abrupto de la placenta o diabetes.  Hay escaso lquido amnitico alrededor del beb.  El beb presenta sufrimiento. La conveniencia o el deseo de que el beb nazca en una cierta fecha no es un motivo para inducir el parto. CULES SON LOS MTODOS UTILIZADOS PARA INDUCIR EL TRABAJO DE PARTO? Algunos mtodos de induccin del trabajo de parto son:  Administracin del medicamentos  prostaglandina. Este medicamento hace que el cuello uterino se dilate y madure. Este medicamento tambin iniciar las contracciones. Puede tomarse por boca o insertarse en la vagina en forma de supositorio.  Insercin en la vagina de un tubo delgado (catter) con un baln en el extremo para dilatar el cuello del tero. Una vez insertado, el baln se infla con agua, lo que provoca la apertura del cuello del tero.  Ruptura de las membranas. El mdico separa el saco amnitico del cuello uterino, haciendo que el cuello uterino se distienda y cause la liberacin de la hormona llamada progesterona. Esto hace que el tero se contraiga. Este procedimiento se realiza durante una visita al consultorio mdico. Le indicarn que vuelva a su casa y espere que se inicien las contracciones. Luego tendr que volver para la induccin.  Ruptura de la bolsa de aguas. El mdico romper el saco amnitico con un pequeo instrumento. Una vez que el saco amnitico se rompe, las contracciones deben comenzar. Pueden pasar algunas horas hasta que haga efecto.  Medicamentos que desencadenen o intensifiquen las contracciones. Se lo administrarn a travs de un catter por va intravenosa (IV) que se inserta en una de las venas del brazo. Todos los mtodos de induccin, excepto la ruptura de membranas, se realizan en el hospital. La induccin se realizar en el hospital, de modo que usted y el beb puedan ser controlados cuidadosamente. CUNTO TIEMPO LLEVA INDUCIR EL TRABAJO DE PARTO? Algunas inducciones pueden demorar entre 2 y 3 das.   Generalmente lleva menos tiempo, dependiendo del estado del cuello del tero. Puede tomar ms tiempo si la induccin se realiza en etapas tempranas del embarazo o es su primer embarazo. Si han pasado 2 o 3 das y no se inicia el trabajo de parto, podrn enviarla a su casa o realizar una cesrea. CULES SON LOS RIESGOS ASOCIADOS CON LA INDUCCiN DEL TRABAJO DE PARTO? Algunos de los riesgos de la  induccin son:  Cambios en la frecuencia cardaca fetal, por ejemplo los latidos son demasiado rpidos, o lentos, o errticos.  Riesgo de distrs fetal.  Posibilidad de infeccin en la madre o el beb.  Aumento de la posibilidad de que sea necesaria una cesrea.  Ruptura (abrupcin) de la placenta del tero (raro).  Ruptura uterina (muy raro). Cuando es necesario realizar la induccin por razones mdicas, los beneficios deben superar a los riesgos. CULES SON ALGUNAS RAZONES PARA NO INDUCIR EL TRABAJO DE PARTO? La induccin no debe realizarse si:  Se demuestra que el beb no tolera el trabajo de parto.  Fue sometida anteriormente a cirugas en el tero, como una miomectoma o le han extirpado fibromas.  La placenta est en una posicin muy baja en el tero y obstruye la abertura del cuello (placenta previa).  El beb no est ubicado con la cabeza hacia bajo.  El cordn umbilical cae hacia el canal de parto, adelante del beb. Esto puede cortar el suministro de sangre y oxgeno al beb.  Fue sometida a una cesrea anteriormente.  Hay circunstancias poco habituales, como que el beb es extremadamente prematuro. Esta informacin no tiene como fin reemplazar el consejo del mdico. Asegrese de hacerle al mdico cualquier pregunta que tenga. Document Released: 07/18/2007 Document Revised: 08/02/2015 Document Reviewed: 11/07/2012 Elsevier Interactive Patient Education  2017 Elsevier Inc.   

## 2016-05-26 NOTE — Progress Notes (Signed)
   PRENATAL VISIT NOTE  Subjective:  Kristy Evans is a 31 y.o. [phone] at 3932w1d being seen today for ongoing prenatal care.  She is currently monitored for the following issues for this high-risk pregnancy and has Hx of preeclampsia, prior pregnancy, currently pregnant; Breast atypical hyperplasia; and Supervision of high-risk pregnancy on her problem list.  Patient reports no complaints.  Contractions: Irregular. Vag. Bleeding: None.  Movement: Present. Denies leaking of fluid.   The following portions of the patient's history were reviewed and updated as appropriate: allergies, current medications, past family history, past medical history, past social history, past surgical history and problem list. Problem list updated.  Objective:   Vitals:   05/26/16 1029  BP: 99/74  Pulse: (!) 127  Weight: 149 lb 9.6 oz (67.9 kg)    Fetal Status: Fetal Heart Rate (bpm): 145 Fundal Height: 39 cm Movement: Present  Presentation: Vertex  General:  Alert, oriented and cooperative. Patient is in no acute distress.  Skin: Skin is warm and dry. No rash noted.   Cardiovascular: Normal heart rate noted  Respiratory: Normal respiratory effort, no problems with respiration noted  Abdomen: Soft, gravid, appropriate for gestational age. Pain/Pressure: Absent     Pelvic:  Cervical exam performed Dilation: 4 Effacement (%): 70 Station: -2  Extremities: Normal range of motion.  Edema: Trace  Mental Status: Normal mood and affect. Normal behavior. Normal judgment and thought content.   Assessment and Plan:  Pregnancy: [id number] at 5332w1d  1. Supervision of high risk pregnancy in third trimester Good cervical change. Postdates testing to start next week if still pregnant.  IOL to be scheduled at 41 weeks.   2. Hx of preeclampsia, prior pregnancy, currently pregnant Stable BP.   Term labor symptoms and general obstetric precautions including but not limited to vaginal bleeding, contractions,  leaking of fluid and fetal movement were reviewed in detail with the patient. Please refer to After Visit Summary for other counseling recommendations.  Return in about 1 week (around 06/02/2016) for OB Visit, NST  06/06/16  NST, AFT  .   Tereso NewcomerUgonna A Peace Noyes, MD

## 2016-06-02 ENCOUNTER — Ambulatory Visit (INDEPENDENT_AMBULATORY_CARE_PROVIDER_SITE_OTHER): Payer: Medicaid Other | Admitting: Obstetrics and Gynecology

## 2016-06-02 ENCOUNTER — Ambulatory Visit (HOSPITAL_COMMUNITY)
Admission: RE | Admit: 2016-06-02 | Discharge: 2016-06-02 | Disposition: A | Discharge status: Home / Self Care | Payer: Medicaid Other | Source: Ambulatory Visit | Attending: Obstetrics and Gynecology | Admitting: Obstetrics and Gynecology

## 2016-06-02 VITALS — BP 128/76 | HR 133 | Wt 150.1 lb

## 2016-06-02 DIAGNOSIS — O09293 Supervision of pregnancy with other poor reproductive or obstetric history, third trimester: Secondary | ICD-10-CM | POA: Diagnosis not present

## 2016-06-02 DIAGNOSIS — O368130 Decreased fetal movements, third trimester, not applicable or unspecified: Secondary | ICD-10-CM

## 2016-06-02 DIAGNOSIS — O288 Other abnormal findings on antenatal screening of mother: Secondary | ICD-10-CM | POA: Insufficient documentation

## 2016-06-02 DIAGNOSIS — Z3A40 40 weeks gestation of pregnancy: Secondary | ICD-10-CM | POA: Diagnosis not present

## 2016-06-02 DIAGNOSIS — O480 Post-term pregnancy: Secondary | ICD-10-CM | POA: Insufficient documentation

## 2016-06-02 DIAGNOSIS — O0993 Supervision of high risk pregnancy, unspecified, third trimester: Secondary | ICD-10-CM

## 2016-06-02 DIAGNOSIS — O09299 Supervision of pregnancy with other poor reproductive or obstetric history, unspecified trimester: Secondary | ICD-10-CM

## 2016-06-02 NOTE — Progress Notes (Signed)
[employer] video interpreter Donald PoreJorge # [phone] used for encounter. Pt reports one episode of clear fluid leaking from vagina after sneezing 3 days ago. She is not sure if it is urine or amniotic fluid. She also reports decreased FM x1 week. BPP ordered for today. IOL scheduled 2/16.

## 2016-06-02 NOTE — Patient Instructions (Signed)
Parto vaginal (Vaginal Delivery) Durante el parto, el mdico la ayudar a dar a luz a su beb. En elparto vaginal, deber pujar para que el beb salga por la vagina. Sin embargo, antes de que pueda sacar al beb, es necesario que ocurran ciertas cosas. La abertura del tero (cuello del tero) tiene que ablandarse, hacerse ms delgado y abrirse (dilatar) hasta que llegue a 10 cm. Adems, el beb tiene que bajar desde el tero a la vagina. SIGNOS DE TRABAJO DE PARTO  El mdico tendr primero que asegurarse de que usted est en trabajo de parto. Algunos signos son:   Eliminar lo que se llama tapn mucoso antes del inicio del trabajo de parto. Este es una pequea cantidad de mucosidad teida con sangre.  Tener contracciones uterinas regulares y dolorosas.   El tiempo entre las contracciones debe acortarse  Las molestias y el dolor se harn ms intensos gradualmente.  El dolor de las contracciones empeora al caminar y no se alivia con el reposo.   El cuello del tero se hace mas delgado (se borra) y se dilata. ANTES DEL PARTO Una vez que se inicie el trabajo de parto y sea admitida en el hospital o sanatorio, el mdico podr hacer lo siguiente:   Realizar un examen fsico.  Controlar si hay complicaciones relacionadas con el trabajo de parto.  Verificar su presin arterial, temperatura y pulso y la frecuencia cardaca (signos vitales).   Determinar si se ha roto el saco amnitico y cundo ha ocurrido.  Realizar un examen vaginal (utilizando un guante estril y un lubricante) para determinar: ? La posicin (presentacin) del beb. El beb se presenta con la cabeza primero (vertex) en el canal de parto (vagina), o estn los pies o las nalgas primero (de nalgas)? ? El nivel (estacin) de la cabeza del beb dentro del canal de parto. ? El borramiento y la dilatacin del cuello uterino  El monitor fetal electrnico generalmente se coloca sobre el abdomen al llegar. Se utiliza para  controlar las contracciones y la frecuencia cardaca del beb. ? Cuando el monitor est en el abdomen (monitor fetal externo), slo toma la frecuencia y la duracin de las contracciones. No informa acerca de la intensidad de las contracciones. ? Si el mdico necesita saber exactamente la intensidad de las contracciones o cul es la frecuencia cardaca del beb, colocar un monitor interno en la vagina y el tero. El mdico comentar los riesgos y los beneficios de usar un monitor interno y le pedir autorizacin antes de colocar el dispositivo. ? El monitoreo fetal continuo ser necesario si le han aplicado una epidural, si le administran ciertos medicamentos (como oxitocina) y si tiene complicaciones del embarazo o del trabajo de parto.  Podrn colocarle una va intravenosa en una vena del brazo para suministrarle lquidos y medicamentos, si es necesario. TRES ETAPAS DEL TRABAJO DE PARTO Y EL PARTO El trabajo de parto y el parto normales se dividen en tres etapas. Primera etapa Esta etapa comienza cuando comienzan las contracciones regulares y el cuello comienza a borrarse y dilatarse. Finaliza cuando el cuello est completamente abierto (completamente dilatado). La primera etapa es la etapa ms larga del trabajo de parto y puede durar desde 3 horas a 15 horas.  Algunos mtodos estn disponibles para ayudar con el dolor del parto. Usted y su mdico decidirn qu opcin es la mejor para usted. Las opciones incluyen:   Medicamentos narcticos. Estos son medicamentos fuertes que usted puede recibir a travs de una va intravenosa o   como inyeccin en el msculo. Estos medicamentos alivian el dolor pero no hacen que desaparezca completamente.  Epidural. Se administra un medicamento a travs de un tubo delgado que se inserta en la espalda. El medicamento adormece la parte inferior del cuerpo y evita el dolor en esa zona.  Bloqueo paracervical Es una inyeccin de un anestsico en cada lado del cuello  uterino.  Usted podr pedir un parto natural, que implica que no se usen analgsicos ni epidural durante el parto y el trabajo de parto. En cambio, podr tener otro tipo de ayuda como ejercicios respiratorios para hacer frente al dolor. Segunda etapa La segunda etapa del trabajo de parto comienza cuando el cuello se ha dilatado completamente a 10 cm. Contina hasta que usted puja al beb hacia abajo, por el canal de parto, y el beb nace. Esta etapa puede durar slo algunos minutos o algunas horas.  La posicin del la cabeza del beb a medida que pasa por el canal de parto, es informada como un nmero, llamado estacin. Si la cabeza del beb no ha iniciado su descenso, la estacin se describe como que est en menos 3 (-3). Cuando la cabeza del beb est en la estacin cero, est en el medio del canal de parto y se encaja en la pelvis. La estacin en la que se encuentra el beb indica el progreso de la segunda etapa del trabajo de parto.  Cuando el beb nace, el mdico lo sostendr con la cabeza hacia abajo para evitar que el lquido amnitico, el moco y la sangre entren en los pulmones del beb. La boca y la nariz del beb podrn ser succionadas con un pequeo bulbo para retirar todo lquido adicional.  El mdico podr colocar al beb sobre su estmago. Es importante evitar que el beb tome fro. Para hacerlo, el mdico secar al beb, lo colocar directamente sobre su piel, (sin mantas entre usted y el beb) y lo cubrir con mantas secas y tibias.  Se corta el cordn umbilical. Tercera etapa Durante la tercera etapa del trabajo de parto, el mdico sacar la placenta (alumbramiento) y se asegurar de que el sangrado est controlado. La salida de la placenta generalmente demora 5 minutos pero puede tardar hasta 30 minutos. Luego de la salida de la placenta, le darn un medicamento por va intravenosa o inyectable para ayudar a contraer el tero y controlar el sangrado. Si planea amamantar al beb,  puede intentar en este momento Luego de la salida de la placenta, el tero debe contraerse y quedar muy firme. Si el tero no queda firme, el mdico lo masajear. Esto es importante debido a que la contraccin del tero ayuda a cortar el sangrado en el sitio en que la placenta estaba unida al tero. Si el tero no se contrae adecuadamente ni permanece firme, podr causar un sangrado abundante. Si hay mucho sangrado, podrn darle medicamentos para contraer el tero y detener el sangrado.  Esta informacin no tiene como fin reemplazar el consejo del mdico. Asegrese de hacerle al mdico cualquier pregunta que tenga. Document Released: 03/23/2008 Document Revised: 05/01/2014 Elsevier Interactive Patient Education  2017 Elsevier Inc.  

## 2016-06-02 NOTE — Progress Notes (Signed)
Subjective:  Kristy Evans is a 31 y.o. [phone] at [redacted]w[redacted]d being seen today for ongoing prenatal care.  She is currently monitored for the following issues for this low-risk pregnancy and has Hx of preeclampsia, prior pregnancy, currently pregnant; Breast atypical hyperplasia; Supervision of high-risk pregnancy; and Non-stress test nonreactive on her problem list.  Patient reports no complaints.  Contractions: Irregular. Vag. Bleeding: None.  Movement: (!) Decreased. Denies leaking of fluid.   The following portions of the patient's history were reviewed and updated as appropriate: allergies, current medications, past family history, past medical history, past social history, past surgical history and problem list. Problem list updated.  Objective:   Vitals:   06/02/16 1000  BP: 128/76  Pulse: (!) 133  Weight: 150 lb 1.6 oz (68.1 kg)    Fetal Status: Fetal Heart Rate (bpm): NST   Movement: (!) Decreased     General:  Alert, oriented and cooperative. Patient is in no acute distress.  Skin: Skin is warm and dry. No rash noted.   Cardiovascular: Normal heart rate noted  Respiratory: Normal respiratory effort, no problems with respiration noted  Abdomen: Soft, gravid, appropriate for gestational age. Pain/Pressure: Present     Pelvic:  Cervical exam deferred        Extremities: Normal range of motion.     Mental Status: Normal mood and affect. Normal behavior. Normal judgment and thought content.   Urinalysis:      Assessment and Plan:  Pregnancy: [id number] at [redacted]w[redacted]d  1. Post term pregnancy, antepartum condition or complication IOL on 06/09/16 - Fetal nonstress test, non reactive Pending BPP results, NST next week 2. Supervision of high risk pregnancy in third trimester Labor precautions  3. Hx of preeclampsia, prior pregnancy, currently pregnant BP stable  4. Non-stress test nonreactive  - US MFM FETAL BPP WO NON STRESS; Future  Term labor symptoms and general  obstetric precautions including but not limited to vaginal bleeding, contractions, leaking of fluid and fetal movement were reviewed in detail with the patient. Please refer to After Visit Summary for other counseling recommendations.  Return in about 4 days (around 06/06/2016) for fetal testing as scheduled.   Hermina StaggersMichael L Dezmin Kittelson, MD

## 2016-06-06 ENCOUNTER — Ambulatory Visit (INDEPENDENT_AMBULATORY_CARE_PROVIDER_SITE_OTHER): Payer: Medicaid Other | Admitting: *Deleted

## 2016-06-06 VITALS — BP 121/71 | HR 93

## 2016-06-06 DIAGNOSIS — O480 Post-term pregnancy: Secondary | ICD-10-CM

## 2016-06-06 NOTE — Progress Notes (Signed)
[employer] video interpreter [place] # [id number] used for encounter.  Pt reports increased UC's and pelvic pressure- mostly @ night. She also reports continued decreased FM however not less than @ previous visit on 2/9.  BPP done on 2/9 - 8/8 and normal AFI. IOL scheduled 2/16.

## 2016-06-06 NOTE — Progress Notes (Signed)
2/13 NST reviewed and reactive

## 2016-06-07 ENCOUNTER — Inpatient Hospital Stay (HOSPITAL_COMMUNITY): Payer: Medicaid Other | Admitting: Anesthesiology

## 2016-06-07 ENCOUNTER — Encounter (HOSPITAL_COMMUNITY): Payer: Self-pay

## 2016-06-07 ENCOUNTER — Inpatient Hospital Stay (HOSPITAL_COMMUNITY)
Admission: AD | Admit: 2016-06-07 | Discharge: 2016-06-08 | DRG: 775 | Disposition: A | Discharge status: Home / Self Care | Payer: Medicaid Other | Source: Ambulatory Visit | Attending: Obstetrics and Gynecology | Admitting: Obstetrics and Gynecology

## 2016-06-07 DIAGNOSIS — Z3A40 40 weeks gestation of pregnancy: Secondary | ICD-10-CM

## 2016-06-07 DIAGNOSIS — Z3493 Encounter for supervision of normal pregnancy, unspecified, third trimester: Secondary | ICD-10-CM | POA: Diagnosis present

## 2016-06-07 DIAGNOSIS — Z3490 Encounter for supervision of normal pregnancy, unspecified, unspecified trimester: Secondary | ICD-10-CM

## 2016-06-07 LAB — COMPREHENSIVE METABOLIC PANEL
ALK PHOS: 251 U/L — AB (ref 38–126)
ALT: 11 U/L — AB (ref 14–54)
AST: 31 U/L (ref 15–41)
Albumin: 3.2 g/dL — ABNORMAL LOW (ref 3.5–5.0)
Anion gap: 9 (ref 5–15)
BUN: 7 mg/dL (ref 6–20)
CHLORIDE: 104 mmol/L (ref 101–111)
CO2: 21 mmol/L — AB (ref 22–32)
CREATININE: 0.49 mg/dL (ref 0.44–1.00)
Calcium: 8.9 mg/dL (ref 8.9–10.3)
GFR calc Af Amer: >60 mL/min (ref >60)
Glucose, Bld: 84 mg/dL (ref 65–99)
Potassium: 3.7 mmol/L (ref 3.5–5.1)
Sodium: 134 mmol/L — ABNORMAL LOW (ref 135–145)
Total Bilirubin: 0.3 mg/dL (ref 0.3–1.2)
Total Protein: 7.1 g/dL (ref 6.5–8.1)

## 2016-06-07 LAB — CBC
HCT: 38.1 % (ref 36.0–46.0)
HEMOGLOBIN: 12.9 g/dL (ref 12.0–15.0)
MCH: 27.3 pg (ref 26.0–34.0)
MCHC: 33.9 g/dL (ref 30.0–36.0)
MCV: 80.5 fL (ref 78.0–100.0)
PLATELETS: 215 10*3/uL (ref 150–400)
RBC: 4.73 MIL/uL (ref 3.87–5.11)
RDW: 15.9 % — ABNORMAL HIGH (ref 11.5–15.5)
WBC: 9.3 10*3/uL (ref 4.0–10.5)

## 2016-06-07 LAB — TYPE AND SCREEN
ABO/RH(D): O POS
ANTIBODY SCREEN: NEGATIVE

## 2016-06-07 LAB — PROTEIN / CREATININE RATIO, URINE
Creatinine, Urine: 42 mg/dL
Total Protein, Urine: <6 mg/dL

## 2016-06-07 LAB — RPR: RPR Ser Ql: NONREACTIVE

## 2016-06-07 MED ORDER — ACETAMINOPHEN 500 MG PO TABS
1000.0000 mg | ORAL_TABLET | Freq: Once | ORAL | Status: AC
  Administered 2016-06-07: 1000 mg via ORAL
  Filled 2016-06-07: qty 2

## 2016-06-07 MED ORDER — ACETAMINOPHEN 325 MG PO TABS
650.0000 mg | ORAL_TABLET | ORAL | Status: DC | PRN
  Administered 2016-06-07 – 2016-06-08 (×4): 650 mg via ORAL
  Filled 2016-06-07 (×4): qty 2

## 2016-06-07 MED ORDER — ONDANSETRON HCL 4 MG/2ML IJ SOLN: 4.0000 mg | INTRAMUSCULAR | Status: DC | PRN

## 2016-06-07 MED ORDER — PHENYLEPHRINE 40 MCG/ML (10ML) SYRINGE FOR IV PUSH (FOR BLOOD PRESSURE SUPPORT): 80.0000 ug | PREFILLED_SYRINGE | INTRAVENOUS | Status: DC | PRN

## 2016-06-07 MED ORDER — OXYTOCIN BOLUS FROM INFUSION
500.0000 mL | Freq: Once | INTRAVENOUS | Status: AC
  Administered 2016-06-07: 500 mL via INTRAVENOUS

## 2016-06-07 MED ORDER — DIPHENHYDRAMINE HCL 25 MG PO CAPS: 25.0000 mg | ORAL_CAPSULE | Freq: Four times a day (QID) | ORAL | Status: DC | PRN

## 2016-06-07 MED ORDER — LACTATED RINGERS IV SOLN
INTRAVENOUS | Status: DC
  Administered 2016-06-07: 05:00:00 via INTRAVENOUS

## 2016-06-07 MED ORDER — ACETAMINOPHEN 325 MG PO TABS: 650.0000 mg | ORAL_TABLET | ORAL | Status: DC | PRN

## 2016-06-07 MED ORDER — EPHEDRINE 5 MG/ML INJ: 10.0000 mg | INTRAVENOUS | Status: DC | PRN

## 2016-06-07 MED ORDER — OXYCODONE-ACETAMINOPHEN 5-325 MG PO TABS: 1.0000 | ORAL_TABLET | ORAL | Status: DC | PRN

## 2016-06-07 MED ORDER — SENNOSIDES-DOCUSATE SODIUM 8.6-50 MG PO TABS
2.0000 | ORAL_TABLET | ORAL | Status: DC
  Administered 2016-06-08: 2 via ORAL
  Filled 2016-06-07: qty 2

## 2016-06-07 MED ORDER — ZOLPIDEM TARTRATE 5 MG PO TABS
5.0000 mg | ORAL_TABLET | Freq: Every evening | ORAL | Status: DC | PRN
Start: 2016-06-07 — End: 2016-06-08

## 2016-06-07 MED ORDER — ONDANSETRON HCL 4 MG PO TABS: 4.0000 mg | ORAL_TABLET | ORAL | Status: DC | PRN

## 2016-06-07 MED ORDER — DIPHENHYDRAMINE HCL 50 MG/ML IJ SOLN: 12.5000 mg | INTRAMUSCULAR | Status: DC | PRN

## 2016-06-07 MED ORDER — WITCH HAZEL-GLYCERIN EX PADS: 1.0000 "application " | MEDICATED_PAD | CUTANEOUS | Status: DC | PRN

## 2016-06-07 MED ORDER — LACTATED RINGERS IV SOLN: 500.0000 mL | Freq: Once | INTRAVENOUS | Status: DC

## 2016-06-07 MED ORDER — DIBUCAINE 1 % RE OINT: 1.0000 "application " | TOPICAL_OINTMENT | RECTAL | Status: DC | PRN

## 2016-06-07 MED ORDER — LIDOCAINE HCL (PF) 1 % IJ SOLN
30.0000 mL | INTRAMUSCULAR | Status: DC | PRN
Start: 2016-06-07 — End: 2016-06-07
  Filled 2016-06-07: qty 30

## 2016-06-07 MED ORDER — PHENYLEPHRINE 40 MCG/ML (10ML) SYRINGE FOR IV PUSH (FOR BLOOD PRESSURE SUPPORT)
80.0000 ug | PREFILLED_SYRINGE | INTRAVENOUS | Status: DC | PRN
  Filled 2016-06-07: qty 10

## 2016-06-07 MED ORDER — OXYCODONE-ACETAMINOPHEN 5-325 MG PO TABS: 2.0000 | ORAL_TABLET | ORAL | Status: DC | PRN

## 2016-06-07 MED ORDER — EPHEDRINE 5 MG/ML INJ
10.0000 mg | INTRAVENOUS | Status: DC | PRN
Start: 2016-06-07 — End: 2016-06-07

## 2016-06-07 MED ORDER — FLEET ENEMA 7-19 GM/118ML RE ENEM: 1.0000 | ENEMA | RECTAL | Status: DC | PRN

## 2016-06-07 MED ORDER — PRENATAL MULTIVITAMIN CH
1.0000 | ORAL_TABLET | Freq: Every day | ORAL | Status: DC
  Administered 2016-06-08: 1 via ORAL
  Filled 2016-06-07: qty 1

## 2016-06-07 MED ORDER — TETANUS-DIPHTH-ACELL PERTUSSIS 5-2.5-18.5 LF-MCG/0.5 IM SUSP: 0.5000 mL | Freq: Once | INTRAMUSCULAR | Status: DC

## 2016-06-07 MED ORDER — LACTATED RINGERS IV SOLN
500.0000 mL | Freq: Once | INTRAVENOUS | Status: AC
  Administered 2016-06-07: 500 mL via INTRAVENOUS

## 2016-06-07 MED ORDER — FENTANYL 2.5 MCG/ML BUPIVACAINE 1/10 % EPIDURAL INFUSION (WH - ANES)
14.0000 mL/h | INTRAMUSCULAR | Status: DC | PRN
  Administered 2016-06-07: 10 mL/h via EPIDURAL
  Filled 2016-06-07: qty 100

## 2016-06-07 MED ORDER — LIDOCAINE HCL (PF) 1 % IJ SOLN
INTRAMUSCULAR | Status: DC | PRN
  Administered 2016-06-07 (×2): 5 mL via EPIDURAL

## 2016-06-07 MED ORDER — SOD CITRATE-CITRIC ACID 500-334 MG/5ML PO SOLN: 30.0000 mL | ORAL | Status: DC | PRN

## 2016-06-07 MED ORDER — LACTATED RINGERS IV SOLN: 500.0000 mL | INTRAVENOUS | Status: DC | PRN

## 2016-06-07 MED ORDER — BENZOCAINE-MENTHOL 20-0.5 % EX AERO
1.0000 "application " | INHALATION_SPRAY | CUTANEOUS | Status: DC | PRN
  Filled 2016-06-07: qty 56

## 2016-06-07 MED ORDER — ONDANSETRON HCL 4 MG/2ML IJ SOLN: 4.0000 mg | Freq: Four times a day (QID) | INTRAMUSCULAR | Status: DC | PRN

## 2016-06-07 MED ORDER — OXYTOCIN 40 UNITS IN LACTATED RINGERS INFUSION - SIMPLE MED
2.5000 [IU]/h | INTRAVENOUS | Status: DC
  Filled 2016-06-07: qty 1000

## 2016-06-07 MED ORDER — IBUPROFEN 600 MG PO TABS
600.0000 mg | ORAL_TABLET | Freq: Four times a day (QID) | ORAL | Status: DC
  Administered 2016-06-07 – 2016-06-08 (×5): 600 mg via ORAL
  Filled 2016-06-07 (×5): qty 1

## 2016-06-07 MED ORDER — COCONUT OIL OIL: 1.0000 "application " | TOPICAL_OIL | Status: DC | PRN

## 2016-06-07 MED ORDER — SIMETHICONE 80 MG PO CHEW: 80.0000 mg | CHEWABLE_TABLET | ORAL | Status: DC | PRN

## 2016-06-07 NOTE — Progress Notes (Signed)
Interpreter in to do admission teaching at 1430 . All questions answered.

## 2016-06-07 NOTE — Anesthesia Postprocedure Evaluation (Signed)
Anesthesia Post Note  Patient: Kristy Evans  Procedure(s) Performed: * No procedures listed *  Patient location during evaluation: Mother Baby Anesthesia Type: Epidural Level of consciousness: awake and alert and oriented Pain management: satisfactory to patient Vital Signs Assessment: post-procedure vital signs reviewed and stable Respiratory status: spontaneous breathing and nonlabored ventilation Cardiovascular status: stable Postop Assessment: no headache, no backache, no signs of nausea or vomiting, adequate PO intake and patient able to bend at knees (patient up walking) Anesthetic complications: no        Last Vitals:  Vitals:   06/07/16 1430 06/07/16 1739  BP: 104/67 106/66  Pulse: 98 94  Resp: 18 18  Temp: 36.8 C 36.8 C    Last Pain:  Vitals:   06/07/16 1840  TempSrc:   PainSc: 3    Pain Goal: Patients Stated Pain Goal: 0 (06/07/16 1301)               Madison HickmanGREGORY,[name]

## 2016-06-07 NOTE — Progress Notes (Signed)
Interpreter at [employer]

## 2016-06-07 NOTE — MAU Note (Signed)
Pt presents complaining of worsening contractions since 2300. States she had some yellowish discharge earlier. Reports good fetal movement. Denies bleeding.

## 2016-06-07 NOTE — MAU Note (Signed)
Report called to Dr. Myrtie SomanWarden re: lab results back, BPs, SVE now 4/100/-2, pt more uncomfortable.

## 2016-06-07 NOTE — H&P (Signed)
LABOR AND DELIVERY ADMISSION HISTORY AND PHYSICAL NOTE  Kristy Evans is a 31 y.o. female [phone] with IUP at 4491w6d by US presenting for labor. History of pre-E with her first child, but not with her second. Upon presentation to MAU BP elevated to 135/107 and had a mild HA without vision changes. Denies CP, SOB, NVD or LE edema. She reports positive fetal movement. She denies leakage of fluid or vaginal bleeding.  Prenatal History/Complications:  Past Medical History: Past Medical History:  Diagnosis Date  . Asthma     Past Surgical History: Past Surgical History:  Procedure Laterality Date  . ABDOMINOPLASTY    . BREAST REDUCTION SURGERY    . RIGHT OOPHORECTOMY      Obstetrical History: OB History    Gravida Para Term Preterm AB Living   4 2 1 1 1 2    SAB TAB Ectopic Multiple Live Births   0 1   0 2      Social History: Social History   Social History  . Marital status: Married    Spouse name: N/A  . Number of children: N/A  . Years of education: N/A   Social History Main Topics  . Smoking status: Never Smoker  . Smokeless tobacco: Never Used  . Alcohol use No  . Drug use: No  . Sexual activity: Yes    Birth control/ protection: None   Other Topics Concern  . None   Social History Narrative  . None    Family History: No family history on file.  Allergies: No Known Allergies  Prescriptions Prior to Admission  Medication Sig Dispense Refill Last Dose  . Prenatal Vit-Fe Fumarate-FA (MULTIVITAMIN-PRENATAL) 27-0.8 MG TABS tablet Take 1 tablet by mouth daily at 12 noon.   Not Taking     Review of Systems   All systems reviewed and negative except as stated in HPI  Blood pressure 129/70, pulse 87, temperature 98.1 F (36.7 C), temperature source Oral, resp. rate 18, last menstrual period 09/15/2015, currently breastfeeding. General appearance: alert and cooperative Lungs: clear to auscultation bilaterally Heart: regular rate and  rhythm Abdomen: soft, non-tender; bowel sounds normal Extremities: No calf swelling or tenderness Presentation: cephalic Fetal monitoring: baseline HR 125, mod variability, pos accels or decels. Contractions q2-404min Uterine activity:  Dilation: 4 Effacement (%): 100 Station: -2 Exam by:: Scientist, product/process developmentAmber Stovall RN   Prenatal labs: ABO, Rh: O/POS/-- (08/15 1049) Antibody: NEG (08/15 1049) Rubella: immune RPR: NON REAC (12/08 1030)  HBsAg: NEGATIVE (08/15 1049)  HIV: NONREACTIVE (12/08 1030)  GBS:  GBS neg 1 hr Glucola: normal 2 hr GTT third Genetic screening:  normal Anatomy US: normal  Prenatal Transfer Tool  Maternal Diabetes: No Genetic Screening: Normal Maternal Ultrasounds/Referrals: Normal Fetal Ultrasounds or other Referrals:  None Maternal Substance Abuse:  No Significant Maternal Medications:  None Significant Maternal Lab Results: None  Results for orders placed or performed during the hospital encounter of 06/07/16 (from the past 24 hour(s))  CBC   Collection Time: 06/07/16  1:59 AM  Result Value Ref Range   WBC 9.3 4.0 - 10.5 K/uL   RBC 4.73 3.87 - 5.11 MIL/uL   Hemoglobin 12.9 12.0 - 15.0 g/dL   HCT 45.438.1 09.836.0 - 11.946.0 %   MCV 80.5 78.0 - 100.0 fL   MCH 27.3 26.0 - 34.0 pg   MCHC 33.9 30.0 - 36.0 g/dL   RDW 14.715.9 (H) 82.911.5 - 56.215.5 %   Platelets 215 150 - 400 K/uL  Comprehensive  metabolic panel   Collection Time: 06/07/16  1:59 AM  Result Value Ref Range   Sodium 134 (L) 135 - 145 mmol/L   Potassium 3.7 3.5 - 5.1 mmol/L   Chloride 104 101 - 111 mmol/L   CO2 21 (L) 22 - 32 mmol/L   Glucose, Bld 84 65 - 99 mg/dL   BUN 7 6 - 20 mg/dL   Creatinine, Ser 1.61 0.44 - 1.00 mg/dL   Calcium 8.9 8.9 - 09.6 mg/dL   Total Protein 7.1 6.5 - 8.1 g/dL   Albumin 3.2 (L) 3.5 - 5.0 g/dL   AST 31 15 - 41 U/L   ALT 11 (L) 14 - 54 U/L   Alkaline Phosphatase 251 (H) 38 - 126 U/L   Total Bilirubin 0.3 0.3 - 1.2 mg/dL   GFR calc non Af Amer >60 >60 mL/min   GFR calc Af Amer >60 >60  mL/min   Anion gap 9 5 - 15  Protein / creatinine ratio, urine   Collection Time: 06/07/16  2:15 AM  Result Value Ref Range   Creatinine, Urine 42.00 mg/dL   Total Protein, Urine <6.00 mg/dL   Protein Creatinine Ratio        0.00 - 0.15 mg/mg[Cre]    Patient Active Problem List   Diagnosis Date Noted  . Non-stress test nonreactive 06/02/2016  . Supervision of high-risk pregnancy 12/07/2015  . 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 Hx of preeclampsia, prior pregnancy, currently pregnant 05/25/2014    Assessment: Kristy Evans is a 31 y.o. [id number] at [redacted]w[redacted]d here for SOL without LOF or vaginal bleeding.  Initially had one elevated BP and given history of pre-E with first pregnancy, pre-E labs were drawn and were all normal.   #Labor: SOL, continue expectant management and anticipate SVD #Pain: epidural #FWB:  Cat 1 #ID: GBS neg #MOF: breast #MOC: interval tubal/nexplanon #Circ: NA  Kristy Musca, MD PGY-1 06/07/2016, 3:45 AM   OB FELLOW HISTORY AND PHYSICAL ATTESTATION  I have seen and examined this patient; I agree with above documentation in the resident's note.    Kristy Mow, DO OB Fellow 06/07/2016, 9:15 AM

## 2016-06-07 NOTE — Anesthesia Preprocedure Evaluation (Signed)
Anesthesia Evaluation  Patient identified by MRN, date of birth, ID band Patient awake    Reviewed: Allergy & Precautions, NPO status , Patient's Chart, lab work & pertinent test results  History of Anesthesia Complications Negative for: history of anesthetic complications  Airway Mallampati: II  TM Distance: >3 FB Neck ROM: Full    Dental  (+) Dental Advisory Given   Pulmonary asthma (no inhaler needed in years) ,    breath sounds clear to auscultation       Cardiovascular negative cardio ROS   Rhythm:Regular Rate:Normal     Neuro/Psych negative neurological ROS     GI/Hepatic negative GI ROS, Neg liver ROS,   Endo/Other  negative endocrine ROS  Renal/GU negative Renal ROS     Musculoskeletal   Abdominal   Peds  Hematology plt 215k   Anesthesia Other Findings   Reproductive/Obstetrics (+) Pregnancy                             Anesthesia Physical Anesthesia Plan  ASA: II  Anesthesia Plan: Epidural   Post-op Pain Management:    Induction:   Airway Management Planned: Natural Airway  Additional Equipment:   Intra-op Plan:   Post-operative Plan:   Informed Consent: I have reviewed the patients History and Physical, chart, labs and discussed the procedure including the risks, benefits and alternatives for the proposed anesthesia with the patient or authorized representative who has indicated his/her understanding and acceptance.     Plan Discussed with:   Anesthesia Plan Comments: (Patient identified. Risks/Benefits/Options discussed with patient including but not limited to bleeding, infection, nerve damage, paralysis, failed block, incomplete pain control, headache, blood pressure changes, nausea, vomiting, reactions to medication both or allergic, itching and postpartum back pain. Confirmed with bedside nurse the patient's most recent platelet count. Confirmed with patient  that they are not currently taking any anticoagulation, have any bleeding history or any family history of bleeding disorders. Patient expressed understanding and wished to proceed. All questions were answered. )        Anesthesia Quick Evaluation

## 2016-06-07 NOTE — Anesthesia Procedure Notes (Signed)
Epidural Patient location during procedure: OB Start time: 06/07/2016 5:00 AM End time: 06/07/2016 5:26 AM  Staffing Anesthesiologist: Jairo BenJACKSON, Rayaan Garguilo Performed: anesthesiologist   Preanesthetic Checklist Completed: patient identified, surgical consent, pre-op evaluation, timeout performed, IV checked, risks and benefits discussed and monitors and equipment checked  Epidural Patient position: sitting Prep: site prepped and draped and DuraPrep Patient monitoring: blood pressure, continuous pulse ox and heart rate Approach: midline Location: L2-L3 Injection technique: LOR air  Needle:  Needle type: Tuohy  Needle gauge: 17 G Needle length: 9 cm Needle insertion depth: 4.5 cm Catheter type: closed end flexible Catheter size: 19 Gauge Catheter at skin depth: 11 cm Test dose: negative (1% lidocaine)  Assessment Events: blood not aspirated, injection not painful, no injection resistance, negative IV test and no paresthesia  Additional Notes Pt identified in Labor room.  Monitors applied. Working IV access confirmed. Sterile prep, drape lumbar spine.  1% lido local L 2,3.  #17ga Touhy LOR air at 4.5 cm L 2,3, cath in easily to 11 cm skin. Test dose OK, cath dosed and infusion begun.  Patient asymptomatic, VSS, no heme aspirated, tolerated well.  Sandford Craze Mikeila Burgen, MD

## 2016-06-07 NOTE — Lactation Note (Signed)
This note was copied from a baby's chart. Lactation Consultation Note  Patient Name: Kristy Evans Today's Date: 06/07/2016 Reason for consult: Initial assessment Baby at 10 hr of life. Mom had a breast reduction and her nipples were removed during the surgery. She stated she does not have full feeling in her nipples. She desires to breast and formula feed. She would like to pump to feed more than latching. Set up DEBP. Discussed baby behavior, feeding frequency, pumping, supplementing volume guidelines, baby belly size, voids, wt loss, breast changes, and nipple care. Given lactation handouts. Aware of OP services and support group.     Maternal Data Has patient been taught Hand Expression?: Yes  Feeding Feeding Type: Bottle Fed - Formula Length of feed: 15 min  LATCH Score/Interventions                      Lactation Tools Discussed/Used Pump Review: Setup, frequency, and cleaning;Milk Storage;Other (comment) (pump settings) Initiated by:: ES Date initiated:: 06/07/16   Consult Status Consult Status: Follow-up Date: 06/08/16 Follow-up type: In-patient    Kristy Evans 06/07/2016, 9:41 PM

## 2016-06-08 MED ORDER — IBUPROFEN 600 MG PO TABS: 600.0000 mg | ORAL_TABLET | Freq: Four times a day (QID) | ORAL | 0 refills | Status: DC

## 2016-06-08 MED ORDER — DOCUSATE SODIUM 100 MG PO CAPS: 100.0000 mg | ORAL_CAPSULE | Freq: Two times a day (BID) | ORAL | 0 refills | Status: DC

## 2016-06-08 NOTE — Discharge Instructions (Signed)
Parto vaginal, cuidados posteriores  (Vaginal Delivery, Care After)  Siga estas instrucciones durante las próximas semanas. Estas indicaciones le proporcionan información acerca de cómo deberá cuidarse después del parto. El médico también podrá darle instrucciones más específicas. El tratamiento ha sido planificado según las prácticas médicas actuales, pero en algunos casos pueden ocurrir problemas. Llame al médico si tiene problemas o preguntas.  QUÉ ESPERAR DESPUÉS DEL PARTO  Después de un parto vaginal, es frecuente tener lo siguiente:  · Hemorragia leve de la vagina.  · Dolor en el abdomen, la vagina y la zona de la piel entre la abertura vaginal y el ano (perineo).  · Calambres pélvicos.  · Fatiga.  INSTRUCCIONES PARA EL CUIDADO EN EL HOGAR  Medicamentos  · Tome los medicamentos de venta libre y los recetados solamente como se lo haya indicado el médico.  · Si le recetaron un antibiótico, tómelo como se lo haya indicado el médico. No interrumpa la administración del antibiótico hasta que lo haya terminado.  Conducir  · No conduzca ni opere maquinaria pesada mientras toma analgésicos recetados.  · No conduzca durante 24 horas si le administraron un sedante.  Estilo de vida  · No beba alcohol. Esto es de suma importancia si está amamantando o toma analgésicos.  · No consuma productos que contengan tabaco, incluidos cigarrillos, tabaco de mascar o cigarrillos electrónicos. Si necesita ayuda para dejar de fumar, consulte al médico.  Comida y bebida  · Beba al menos 8 vasos de 8 onzas (240 cc) de agua todos los días a menos que el médico le indique lo contrario. Si elige amamantar al bebé, quizá deba beber aún más cantidad de agua.  · Ingiera alimentos ricos en fibras todos los días. Estos alimentos pueden ayudarla a prevenir o aliviar el estreñimiento. Los alimentos ricos en fibras incluyen, entre otros:  ? Panes y cereales integrales.  ? Arroz integral.  ? Frijoles.  ? Frutas y verduras  frescas.  Actividad  · Reanude sus actividades normales como se lo haya indicado el médico. Pregúntele al médico qué actividades son seguras para usted.  · Descanse todo lo que pueda. Trate de descansar o tomar una siesta mientras el bebé está durmiendo.  · No levante objetos que pesen más de 10 libras (4,5 kg) hasta que el médico le diga que es seguro hacerlo.  · Hable con el médico sobre cuándo puede volver a tener relaciones sexuales. Esto puede depender de lo siguiente:  ? Riesgo de sufrir infecciones.  ? Velocidad de cicatrización.  ? Comodidad y deseo de tener relaciones sexuales.  Cuidados vaginales  · Si le realizaron una episiotomía o tuvo un desgarro vaginal, contrólese la zona todos los días para detectar signos de infección. Esté atenta a los siguientes signos:  ? Aumento del enrojecimiento, la hinchazón o el dolor.  ? Más líquido o sangre.  ? Calor.  ? Pus o mal olor.  · No use tampones ni se haga duchas vaginales hasta que el médico la autorice.  · Controle la sangre que elimina por la vagina para detectar coágulos. Pueden tener el aspecto de grumos de color rojo oscuro, marrón o negro.  Instrucciones generales  · Mantenga el perineo limpio y seco, como se lo haya indicado el médico.  · Use ropa cómoda y suelta.  · Cuando vaya al baño, siempre higienícese de adelante hacia atrás.  · Pregúntele al médico si puede ducharse o tomar baños de inmersión. Si se le realizó una episiotomía o tuvo un desgarro perineal durante el trabajo del parto o   el parto, es posible que el médico le indique que no tome baños de inmersión durante un determinado tiempo.  · Use un sostén que sujete y ajuste bien sus pechos.  · Si es posible, pídale a alguien que la ayude con las tareas del hogar y a cuidar del bebé durante al menos algunos días después de salir del hospital.  · Concurra a todas las visitas de seguimiento para usted y el bebé, como se lo haya indicado el médico. Esto es importante.  SOLICITE ATENCIÓN MÉDICA  SI:  · Tiene los siguientes síntomas:  ? Secreción vaginal que tiene mal olor.  ? Dificultad para orinar.  ? Dolor al orinar.  ? Aumento o disminución repentinos de la frecuencia con que defeca.  ? Más enrojecimiento, hinchazón o dolor alrededor de la episiotomía o del desgarro vaginal.  ? Más secreción de líquido o sangre de la episiotomía o desgarro vaginal.  ? Pus o mal olor proveniente de la episiotomía o el desgarro vaginal.  ? Fiebre.  ? Erupción cutánea.  ? Poco interés o falta de interés en actividades que solían gustarle.  ? Dudas sobre su cuidado y el del bebé.  · Siente la episiotomía o el desgarro vaginal caliente al tacto.  · La episiotomía o el desgarro vaginal se está abriendo o no parece cicatrizar.  · Siente dolor en las mamas, o están duras o enrojecidas.  · Siente tristeza o preocupación de forma inusual.  · Siente náuseas o vomita.  · Elimina coágulos grandes por la vagina. Si expulsa un coágulo sanguíneo por la vagina, guárdelo para mostrárselo a su médico. No tire la cadena sin que el médico examine el coágulo antes.  · Orina más de lo habitual.  · Se siente mareada o se desmaya.  · No ha amamantado para nada y no ha tenido un período menstrual durante 12 semanas después del parto.  · Dejó de amamantar al bebé y no ha tenido su período menstrual durante 12 semanas después de dejar de amamantar.    SOLICITE ATENCIÓN MÉDICA DE INMEDIATO SI:  · Tiene los siguientes síntomas:  ? Dolor que no desaparece o no mejora con el medicamento.  ? Dolor en el pecho.  ? Dificultad para respirar.  ? Visión borrosa o manchas en la vista.  ? Pensamientos de autolesionarse o lesionar al bebé.  · Comienza a sentir dolor en el abdomen o en una de las piernas.  · Dolor de cabeza intenso.  · Se desmaya.  · Tiene una hemorragia tan intensa de la vagina que empapa dos toallitas sanitarias en una hora.    Esta información no tiene como fin reemplazar el consejo del médico. Asegúrese de hacerle al médico cualquier  pregunta que tenga.  Document Released: 04/10/2005 Document Revised: 08/02/2015 Document Reviewed: 04/25/2015  Elsevier Interactive Patient Education © 2017 [employer]vier Inc[employer]

## 2016-06-08 NOTE — Discharge Summary (Signed)
OB Discharge Summary     Patient Name: Kristy Evans DOB: [birth year] MRN: [id number]  Date of admission: 06/07/2016 Delivering MD: Lorne SkeensSCHENK, NICHOLAS MICHAEL   Date of discharge: 06/08/2016  Admitting diagnosis: 5270w6d Contractions Intrauterine pregnancy: 3170w6d     Secondary diagnosis:  Principal Problem:   NSVD (normal spontaneous vaginal delivery) Active Problems:   Pregnancy  Additional problems: None     Discharge diagnosis: Term Pregnancy Delivered                                                                                                Post partum procedures:Nne  Augmentation: AROM  Complications: None  Hospital course:  Induction of Labor With Vaginal Delivery   31 y.o. yo [phone] at 2370w6d was admitted to the hospital 06/07/2016 for induction of labor.  Indication for induction: Postdates and Non-reactive NST.  Patient had an uncomplicated labor course as follows: Membrane Rupture Time/Date: 11:30 AM ,06/07/2016   Intrapartum Procedures: Episiotomy: None [1]                                         Lacerations:     Patient had delivery of a Viable infant.  Information for the patient's newborn:  Shearon StallsOgando De Evans, Girl Bari EdwardYairy [[id number]][[id number]]  Delivery Method: Vaginal, Spontaneous Delivery (Filed from Delivery Summary)   06/07/2016  Details of delivery can be found in separate delivery note.  Patient had a routine postpartum course. Patient is discharged home 06/08/16.  Physical exam  Vitals:   06/07/16 1332 06/07/16 1430 06/07/16 1739 06/08/16 0500  BP: 112/65 104/67 106/66 110/65  Pulse: 80 98 94 92  Resp: 18 18 18 18   Temp: 98.6 F (37 C) 98.2 F (36.8 C) 98.2 F (36.8 C) 98.1 F (36.7 C)  TempSrc: Oral Oral Oral Oral  SpO2: 99%  98%   Weight:      Height:       General: alert, cooperative and no distress Lochia: appropriate Uterine Fundus: firm Incision: N/A DVT Evaluation: No evidence of DVT seen on physical exam. Negative Homan's  sign. No cords or calf tenderness. Labs: Lab Results  Component Value Date   WBC 9.3 06/07/2016   HGB 12.9 06/07/2016   HCT 38.1 06/07/2016   MCV 80.5 06/07/2016   PLT 215 06/07/2016   CMP Latest Ref Rng & Units 06/07/2016  Glucose 65 - 99 mg/dL 84  BUN 6 - 20 mg/dL 7  Creatinine 1.320.44 - 4.401.00 mg/dL 1.020.49  Sodium 725135 - 366145 mmol/L 134(L)  Potassium 3.5 - 5.1 mmol/L 3.7  Chloride 101 - 111 mmol/L 104  CO2 22 - 32 mmol/L 21(L)  Calcium 8.9 - 10.3 mg/dL 8.9  Total Protein 6.5 - 8.1 g/dL 7.1  Total Bilirubin 0.3 - 1.2 mg/dL 0.3  Alkaline Phos 38 - 126 U/L 251(H)  AST 15 - 41 U/L 31  ALT 14 - 54 U/L 11(L)    Discharge instruction: per After Visit Summary and "Baby and Me Booklet".  After  visit meds:  Allergies as of 06/08/2016   No Known Allergies     Medication List    TAKE these medications   docusate sodium 100 MG capsule Commonly known as:  COLACE Take 1 capsule (100 mg total) by mouth 2 (two) times daily.   ibuprofen 600 MG tablet Commonly known as:  ADVIL,MOTRIN Take 1 tablet (600 mg total) by mouth every 6 (six) hours.   multivitamin-prenatal 27-0.8 MG Tabs tablet Take 1 tablet by mouth daily at 12 noon.       Diet: routine diet  Activity: Advance as tolerated. Pelvic rest for 6 weeks.   Outpatient follow up:6 weeks Follow up Appt:Future Appointments Date Time Provider Department Center  07/11/2016 10:40 AM Donette Larry, CNM [facility]   Follow up Visit:No Follow-up on file.  Postpartum contraception: Nexplanon  Newborn Data: Live born female  Birth Weight: 7 lb 14.6 oz (3589 g) APGAR: 9, 9  Baby Feeding: Bottle and Breast Disposition:home with mother   06/08/2016 Jen Mow, DO OB Fellow

## 2016-06-08 NOTE — Lactation Note (Signed)
This note was copied from a baby's chart. Lactation Consultation Note: Dad translated for my visit. Mom with history of  breast reduction with nipple removal. Mom attempting to latch baby as I went into room. Baby is doing a lot of smacking at the breast. Reviewed with parents how to hold breast. (mom is keeping space for baby to breath) Encouraged to wait for wide open mouth and keep baby close to the breast throughout the feeding. Mom reports nipples are getting sore- encouraged EBM to nipple. Comfort gels given with instructions for use. Used DEBP yesterday but did not get any Colostrum. Dad asking about pump for home. Suggested DEBP to help promote milk supply. They will think about it and make decision. Plans to continue attempting to latch baby at this time and supplementing with formula.  No questions at present Reviewed our phone number to call with questions/concerns and OP appointments as a resource for assist after DC. To call prn  Patient Name: Kristy Donell SievertYairy Ogando De Evans Today's Date: 06/08/2016 Reason for consult: Follow-up assessment;Breast surgery   Maternal Data Formula Feeding for Exclusion: Yes Has patient been taught Hand Expression?: Yes Does the patient have breastfeeding experience prior to this delivery?: Yes  Feeding Feeding Type: Breast Fed Length of feed: 10 min  LATCH Score/Interventions Latch: Repeated attempts needed to sustain latch, nipple held in mouth throughout feeding, stimulation needed to elicit sucking reflex.  Audible Swallowing: None  Type of Nipple: Flat  Comfort (Breast/Nipple): Filling, red/small blisters or bruises, mild/mod discomfort  Problem noted: Mild/Moderate discomfort Interventions (Mild/moderate discomfort): Hand expression;Comfort gels  Hold (Positioning): Assistance needed to correctly position infant at breast and maintain latch. Intervention(s): Breastfeeding basics reviewed  LATCH Score: 4  Lactation Tools Discussed/Used      Consult Status Consult Status: Complete    Pamelia HoitWeeks, Asyria Kolander D 06/08/2016, 2:12 PM

## 2016-06-09 ENCOUNTER — Inpatient Hospital Stay (HOSPITAL_COMMUNITY): Admission: RE | Admit: 2016-06-09 | Payer: Managed Care, Other (non HMO) | Source: Ambulatory Visit

## 2016-07-11 ENCOUNTER — Ambulatory Visit: Payer: Managed Care, Other (non HMO) | Admitting: Certified Nurse Midwife

## 2016-07-11 ENCOUNTER — Ambulatory Visit: Payer: Medicaid Other | Admitting: Certified Nurse Midwife

## 2016-07-13 ENCOUNTER — Ambulatory Visit (INDEPENDENT_AMBULATORY_CARE_PROVIDER_SITE_OTHER): Payer: Medicaid Other | Admitting: Family Medicine

## 2016-07-13 ENCOUNTER — Encounter: Payer: Self-pay | Admitting: Family Medicine

## 2016-07-13 DIAGNOSIS — Z3049 Encounter for surveillance of other contraceptives: Secondary | ICD-10-CM | POA: Diagnosis not present

## 2016-07-13 DIAGNOSIS — Z30017 Encounter for initial prescription of implantable subdermal contraceptive: Secondary | ICD-10-CM

## 2016-07-13 DIAGNOSIS — Z3202 Encounter for pregnancy test, result negative: Secondary | ICD-10-CM | POA: Diagnosis not present

## 2016-07-13 MED ORDER — DOCUSATE SODIUM 100 MG PO CAPS: 100.0000 mg | ORAL_CAPSULE | Freq: Two times a day (BID) | ORAL | 0 refills | Status: DC

## 2016-07-13 MED ORDER — ETONOGESTREL 68 MG ~~LOC~~ IMPL
68.0000 mg | DRUG_IMPLANT | Freq: Once | SUBCUTANEOUS | Status: AC
  Administered 2016-07-13: 68 mg via SUBCUTANEOUS

## 2016-07-13 MED ORDER — DOCUSATE SODIUM 100 MG PO CAPS: 100.0000 mg | ORAL_CAPSULE | Freq: Two times a day (BID) | ORAL | 3 refills | Status: AC

## 2016-07-13 NOTE — Addendum Note (Signed)
Addended by: Clearnce SorrelPICKARD, Jaylise Peek S on: 07/13/2016 03:17 PM   Modules accepted: Orders

## 2016-07-13 NOTE — Progress Notes (Addendum)
Subjective:     Kristy Evans is a 30 y.o. female who presents for a postpartum visit. She is 5 weeks postpartum following a spontaneous vaginal delivery. I have fully reviewed the prenatal and intrapartum course. The delivery was at [redacted]w[redacted]d gestational weeks. Outcome: spontaneous vaginal delivery. Anesthesia: epidural. Postpartum course has been normal. Baby's course has been normal. Baby is feeding by breast. Bleeding no bleeding. Bowel function is complicated with constipation. Bladder function is normal. Patient is sexually active. Contraception method is Nexplanon. Postpartum depression screening: negative.  The following portions of the patient's history were reviewed and updated as appropriate: allergies, current medications, past family history, past medical history, past social history, past surgical history and problem list.  Review of Systems Pertinent items are noted in HPI.   Objective:    There were no vitals taken for this visit.  General:  alert and cooperative  Lungs: clear to auscultation bilaterally  Heart:  regular rate and rhythm, S1, S2 normal, no murmur, click, rub or gallop  Abdomen: soft, non-tender; bowel sounds normal; no masses,  no organomegaly  Pelvic Exam: Deferred by patient        Assessment:     Normal postpartum exam. Pap smear not done at today's visit.   Plan:    1. Contraception: Nexplanon - placed today 2. Follow up in: 1 year or as needed.     Nexplanon Insertion:  Patient given informed consent, signed copy in the chart, time out was performed. Pregnancy test was neg. Appropriate time out taken.  Patient's left arm was prepped and draped in the usual sterile fashion.. The ruler used to measure and mark insertion area.  Pt was prepped with alcohol swab and then injected with 5 cc of 1% lidocaine with epinephrine.  Pt was prepped with betadine, Implanon removed form packaging,  Device confirmed in needle, then inserted full length of  needle and withdrawn per handbook instructions.  Device palpated by physician and patient.  Pt insertion site covered with pressure dressing.   Minimal blood loss.  Pt tolerated the procedure well.    

## 2016-07-13 NOTE — Progress Notes (Signed)
Subjective:     Kristy Evans is a 31 y.o. female who presents for a postpartum visit. She is 5 weeks postpartum following a spontaneous vaginal delivery. I have fully reviewed the prenatal and intrapartum course. The delivery was at 9570w6d gestational weeks. Outcome: spontaneous vaginal delivery. Anesthesia: epidural. Postpartum course has been normal. Baby's course has been normal. Baby is feeding by breast. Bleeding no bleeding. Bowel function is complicated with constipation. Bladder function is normal. Patient is sexually active. Contraception method is Nexplanon. Postpartum depression screening: negative.  The following portions of the patient's history were reviewed and updated as appropriate: allergies, current medications, past family history, past medical history, past social history, past surgical history and problem list.  Review of Systems Pertinent items are noted in HPI.   Objective:    There were no vitals taken for this visit.  General:  alert and cooperative  Lungs: clear to auscultation bilaterally  Heart:  regular rate and rhythm, S1, S2 normal, no murmur, click, rub or gallop  Abdomen: soft, non-tender; bowel sounds normal; no masses,  no organomegaly  Pelvic Exam: Deferred by patient        Assessment:     Normal postpartum exam. Pap smear not done at today's visit.   Plan:    1. Contraception: Nexplanon - placed today 2. Follow up in: 1 year or as needed.     Nexplanon Insertion:  Patient given informed consent, signed copy in the chart, time out was performed. Pregnancy test was neg. Appropriate time out taken.  Patient's left arm was prepped and draped in the usual sterile fashion.. The ruler used to measure and mark insertion area.  Pt was prepped with alcohol swab and then injected with 5 cc of 1% lidocaine with epinephrine.  Pt was prepped with betadine, Implanon removed form packaging,  Device confirmed in needle, then inserted full length of  needle and withdrawn per handbook instructions.  Device palpated by physician and patient.  Pt insertion site covered with pressure dressing.   Minimal blood loss.  Pt tolerated the procedure well.

## 2018-01-13 IMAGING — US US MFM OB COMP +14 WKS
1 series · 14 of 28 positions shown · non-contrast
Comparison: none

[Series 1: us mfm ob comp +14 wks · 81 acquisitions, 14 frames shown]
[im 3/81]
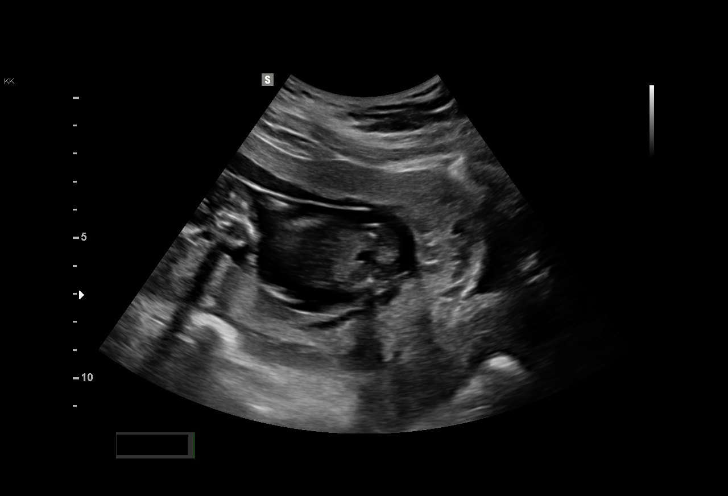
[im 9/81]
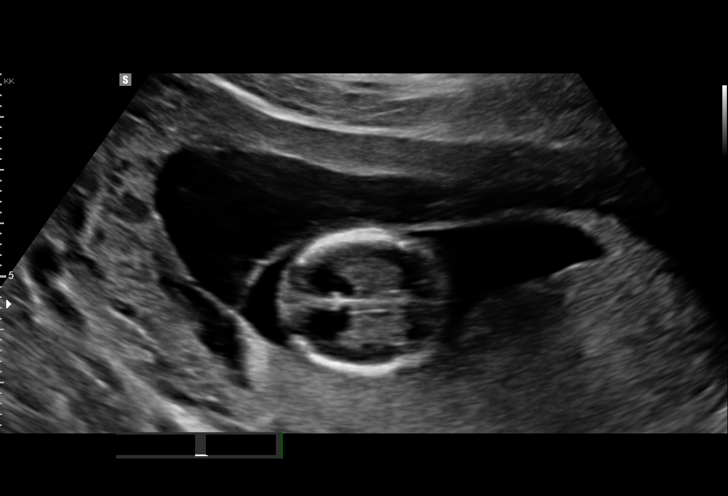
[im 15/81]
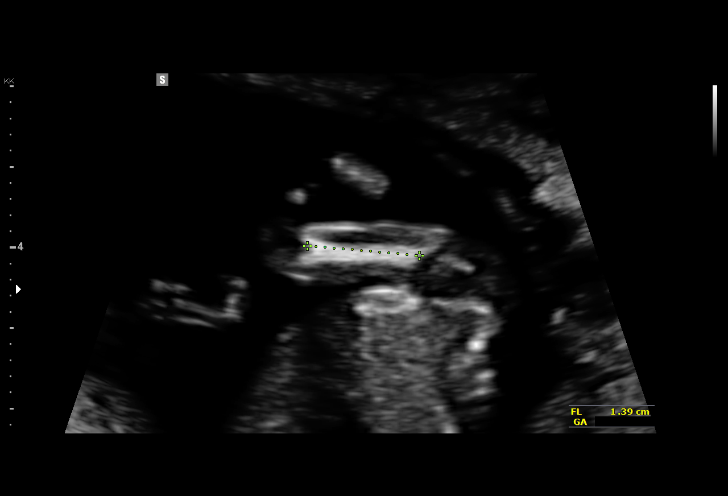
[im 21/81]
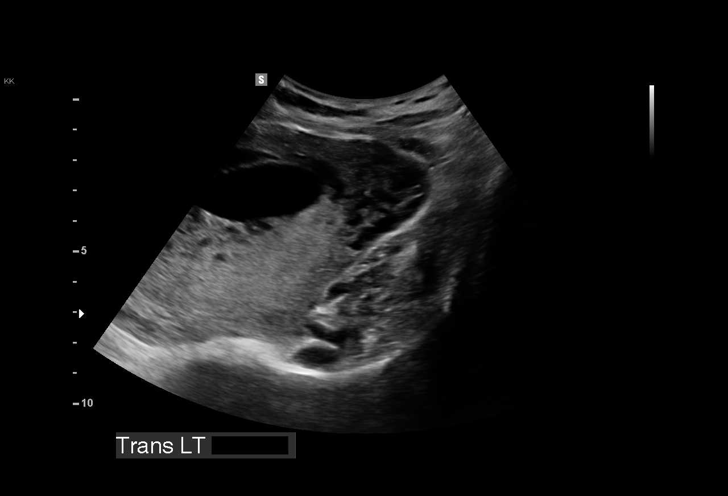
[im 27/81]
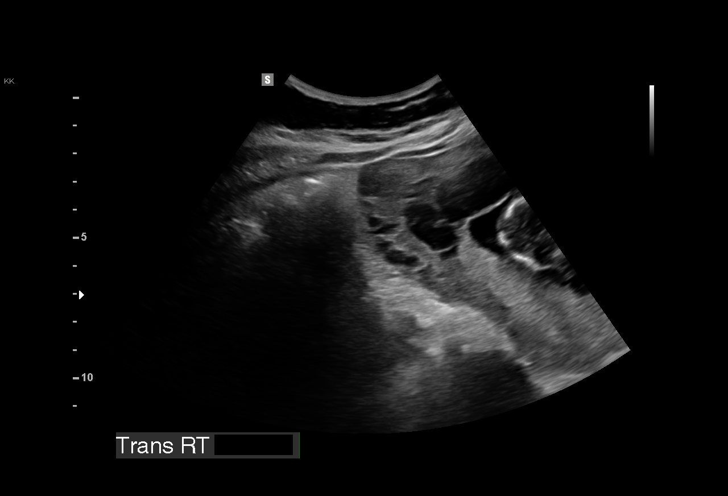
[im 33/81]
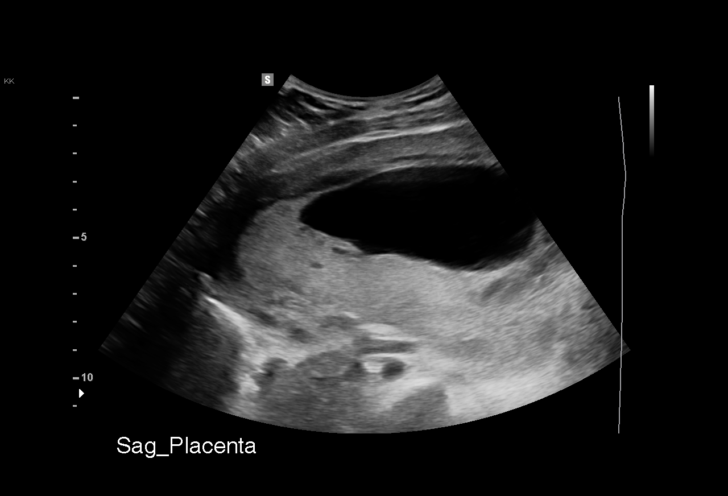
[im 39/81]
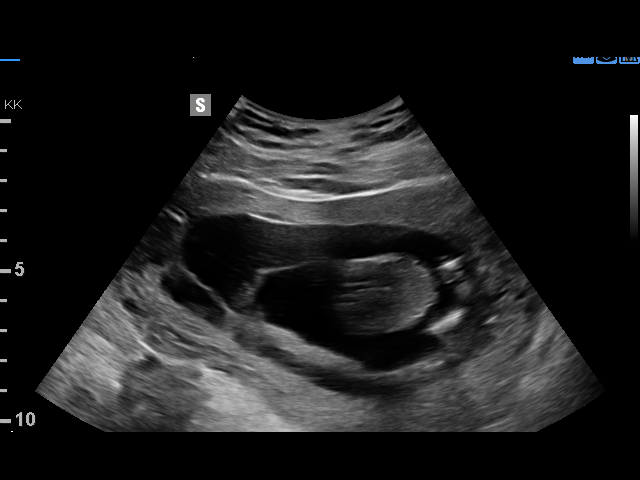
[im 45/81]
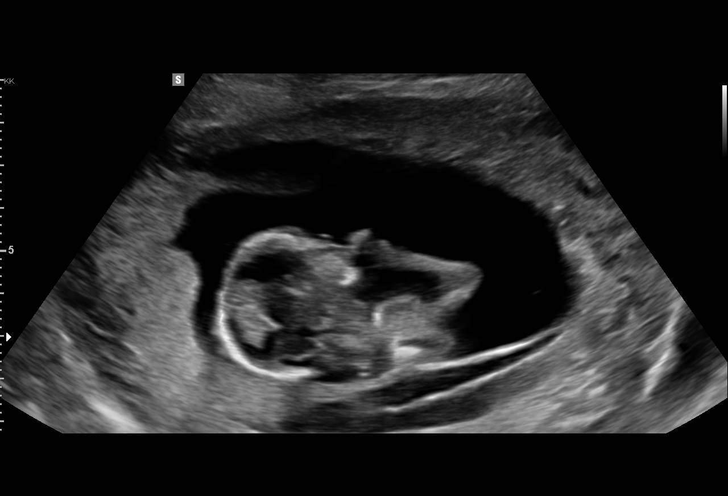
[im 51/81]
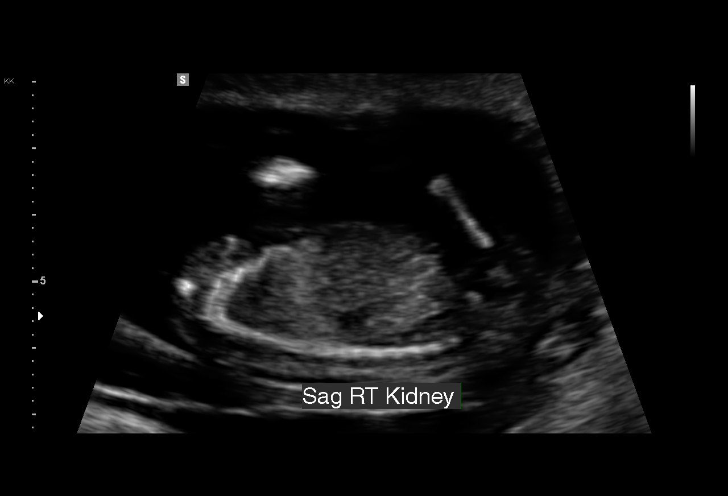
[im 57/81]
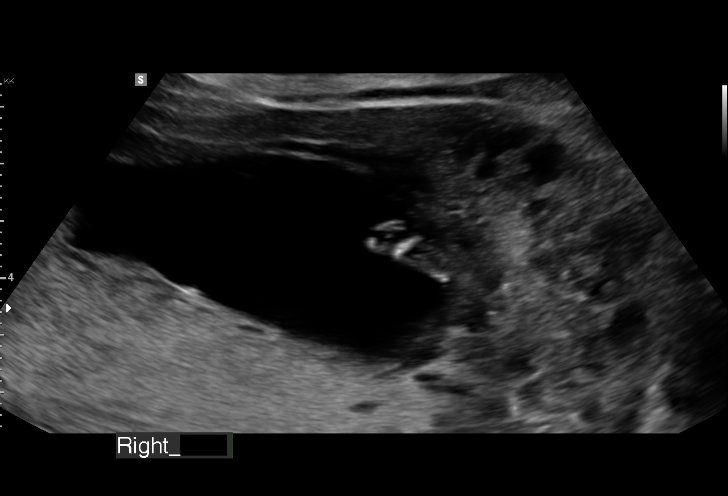
[im 63/81]
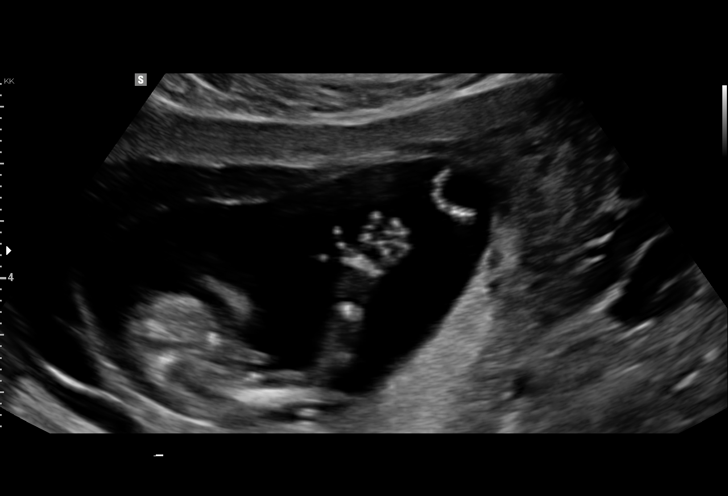
[im 69/81]
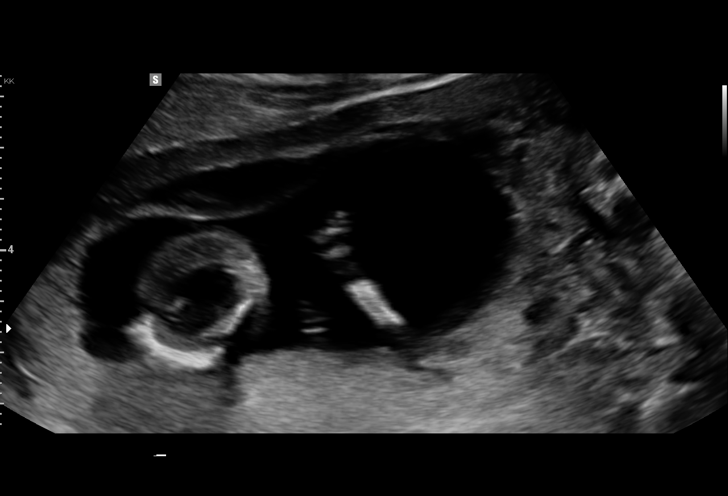
[im 75/81]
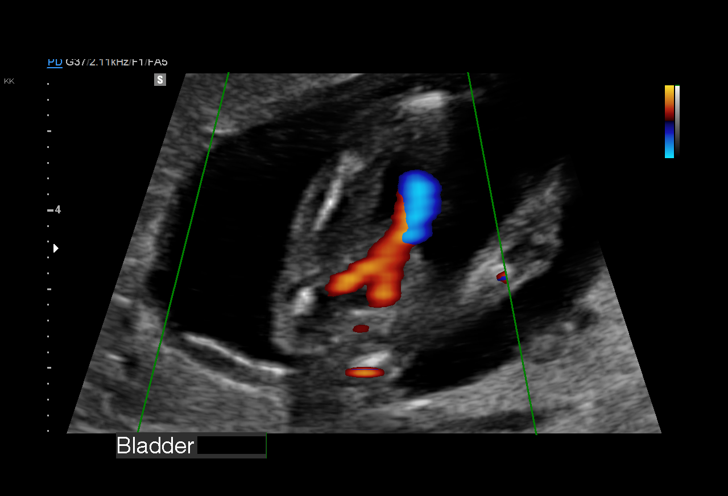
[im 81/81]
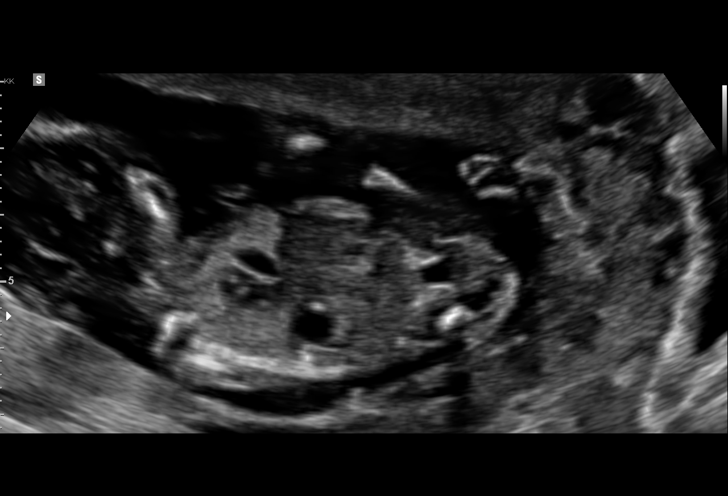

[14 of 28 positions shown; findings below may reference images not displayed]

pm)

[REDACTED]

1  ERXLEBEN           [id number]      [id number]     [id number]
Indications

14 weeks gestation of pregnancy
Vaginal bleeding in pregnancy, second
trimester
Short interval between pregancies, 2nd
trimester
Subchorionic hemorrhage, antepartum
Basic anatomic survey                          Z36
OB History

Gravidity:    4         Term:   1        Prem:   1        SAB:   1
TOP:          0       Ectopic:  0        Living: 2
Fetal Evaluation

Num Of Fetuses:     1
Fetal Heart         150
Rate(bpm):
Cardiac Activity:   Observed
Presentation:       Breech
Placenta:           Posterior
P. Cord Insertion:  Visualized

Amniotic Fluid
AFI FV:      Subjectively within normal limits

Largest Pocket(cm)
3.3

Comment:    Moderate subchorionic hemorrhage noted 6.4 x 2.9 x 6.0 cm.
Biometry

BPD:      27.7  mm     G. Age:  15w 0d                  CI:        66.59   %   70 - 86
FL/HC:      13.0   %   15.3 -
HC:      108.8  mm     G. Age:  15w 1d                  HC/AC:      1.24       1.05 -
AC:       87.8  mm     G. Age:  15w 0d                  FL/BPD:     50.9   %
FL:       14.1  mm     G. Age:  14w 1d                  FL/AC:      16.1   %   20 - 24
HUM:      14.8  mm     G. Age:  14w 0d

Est. FW:     103  gm      0 lb 4 oz
Gestational Age

LMP:           11w 6d       Date:   09/15/15                 EDD:   06/21/16
U/S Today:     14w 6d                                        EDD:   05/31/16
Best:          14w 6d    Det. By:   U/S (12/07/15)           EDD:   05/31/16
Anatomy

Cranium:               Appears normal         Aortic Arch:            Not well visualized
Cavum:                 Not well visualized    Ductal Arch:            Not well visualized
Ventricles:            Not well visualized    Diaphragm:              Appears normal
Choroid Plexus:        Appears normal         Stomach:                Appears normal, left
sided
Cerebellum:            Not well visualized    Abdomen:                Appears normal
Posterior Fossa:       Not well visualized    Abdominal Wall:         Appears nml (cord
insert, abd wall)
Nuchal Fold:           Not well visualized    Cord Vessels:           Appears normal (3
vessel cord)
Face:                  Profile nl; orbits not Kidneys:                Appear normal
well visualized
Lips:                  Not well visualized    Bladder:                Appears normal
Heart:                 Not well visualized    Spine:                  Not well visualized
RVOT:                  Not well visualized    Upper Extremities:      Appears normal
LVOT:                  Not well visualized    Lower Extremities:      Appears normal

Other:  5th digit visualized. Technically difficult due to early gestational age.
Cervix Uterus Adnexa

Cervix
Normal appearance by transabdominal scan.

Left Ovary
Within normal limits.

Right Ovary
Not visualized.

Adnexa:       No abnormality visualized.
Impression

SIUP at 14+6 weeks
No gross abnormalities identified
Normal amniotic fluid volume
EDC based on today's measurements: 05/31/16
Moderate subchorionic hemorrhage located on the anterior
uterine wall
Normal cervical length without funneling
Recommendations

Follow-up ultrasound in 4 weeks for anatomic survey
